# Patient Record
Sex: Female | Born: 1975
Health system: Southern US, Community
[De-identification: ages and names within clinical notes are randomized; demographics above are authoritative.]

## PROBLEM LIST (undated history)

## (undated) DIAGNOSIS — I10 Essential (primary) hypertension: Secondary | ICD-10-CM

## (undated) HISTORY — PX: EYE SURGERY: SHX253

## (undated) HISTORY — PX: HYSTERECTOMY ABDOMINAL WITH SALPINGECTOMY: SHX6725

---

## 2014-06-09 DIAGNOSIS — I10 Essential (primary) hypertension: Secondary | ICD-10-CM | POA: Diagnosis present

## 2019-08-26 ENCOUNTER — Encounter (HOSPITAL_COMMUNITY): Payer: Self-pay | Admitting: Obstetrics and Gynecology

## 2019-08-26 ENCOUNTER — Other Ambulatory Visit: Payer: Self-pay

## 2019-08-26 ENCOUNTER — Observation Stay (HOSPITAL_COMMUNITY)
Admission: EM | Admit: 2019-08-26 | Discharge: 2019-08-29 | Disposition: A | Discharge status: Still In Hospital | Payer: 59 | Attending: Family Medicine | Admitting: Family Medicine

## 2019-08-26 ENCOUNTER — Ambulatory Visit (INDEPENDENT_AMBULATORY_CARE_PROVIDER_SITE_OTHER): Payer: Self-pay | Admitting: Family Medicine

## 2019-08-26 DIAGNOSIS — Z20822 Contact with and (suspected) exposure to covid-19: Secondary | ICD-10-CM | POA: Diagnosis not present

## 2019-08-26 DIAGNOSIS — K922 Gastrointestinal hemorrhage, unspecified: Secondary | ICD-10-CM | POA: Diagnosis present

## 2019-08-26 DIAGNOSIS — I10 Essential (primary) hypertension: Secondary | ICD-10-CM | POA: Diagnosis not present

## 2019-08-26 DIAGNOSIS — K625 Hemorrhage of anus and rectum: Principal | ICD-10-CM | POA: Insufficient documentation

## 2019-08-26 HISTORY — DX: Essential (primary) hypertension: I10

## 2019-08-26 LAB — COMPREHENSIVE METABOLIC PANEL
ALT: 15 U/L (ref 0–44)
AST: 17 U/L (ref 15–41)
Albumin: 4.2 g/dL (ref 3.5–5.0)
Alkaline Phosphatase: 46 U/L (ref 38–126)
Anion gap: 8 (ref 5–15)
BUN: 14 mg/dL (ref 6–20)
CO2: 26 mmol/L (ref 22–32)
Calcium: 8.9 mg/dL (ref 8.9–10.3)
Chloride: 106 mmol/L (ref 98–111)
Creatinine, Ser: 0.97 mg/dL (ref 0.44–1.00)
GFR calc Af Amer: >60 mL/min (ref >60)
GFR calc non Af Amer: >60 mL/min (ref >60)
Glucose, Bld: 101 mg/dL — ABNORMAL HIGH (ref 70–99)
Potassium: 3.9 mmol/L (ref 3.5–5.1)
Sodium: 140 mmol/L (ref 135–145)
Total Bilirubin: 0.5 mg/dL (ref 0.3–1.2)
Total Protein: 7.1 g/dL (ref 6.5–8.1)

## 2019-08-26 LAB — CBC
HCT: 38.4 % (ref 36.0–46.0)
Hemoglobin: 12.3 g/dL (ref 12.0–15.0)
MCH: 28.1 pg (ref 26.0–34.0)
MCHC: 32 g/dL (ref 30.0–36.0)
MCV: 87.7 fL (ref 80.0–100.0)
Platelets: 281 10*3/uL (ref 150–400)
RBC: 4.38 MIL/uL (ref 3.87–5.11)
RDW: 14.4 % (ref 11.5–15.5)
WBC: 9 10*3/uL (ref 4.0–10.5)
nRBC: 0 % (ref 0.0–0.2)

## 2019-08-26 LAB — TYPE AND SCREEN
ABO/RH(D): O POS
Antibody Screen: NEGATIVE

## 2019-08-26 NOTE — ED Triage Notes (Signed)
Dr. Laurann Montana phones at this time to tell us she is sending this pt., whom she has seen in her office. Pt. C/o several episodes of hematochezia today.

## 2019-08-26 NOTE — ED Triage Notes (Signed)
Patient reports she has been having bloody/mucous like stools. Patient reports she feels lightheaded.

## 2019-08-27 ENCOUNTER — Emergency Department (HOSPITAL_COMMUNITY): Payer: 59

## 2019-08-27 ENCOUNTER — Encounter (HOSPITAL_COMMUNITY): Payer: Self-pay

## 2019-08-27 ENCOUNTER — Other Ambulatory Visit: Payer: Self-pay

## 2019-08-27 DIAGNOSIS — K922 Gastrointestinal hemorrhage, unspecified: Secondary | ICD-10-CM | POA: Diagnosis present

## 2019-08-27 DIAGNOSIS — K625 Hemorrhage of anus and rectum: Principal | ICD-10-CM

## 2019-08-27 LAB — CBC WITH DIFFERENTIAL/PLATELET
Abs Immature Granulocytes: 0.07 10*3/uL (ref 0.00–0.07)
Basophils Absolute: 0.1 10*3/uL (ref 0.0–0.1)
Basophils Relative: 1 %
Eosinophils Absolute: 0.2 10*3/uL (ref 0.0–0.5)
Eosinophils Relative: 1 %
HCT: 36.2 % (ref 36.0–46.0)
Hemoglobin: 11.7 g/dL — ABNORMAL LOW (ref 12.0–15.0)
Immature Granulocytes: 1 %
Lymphocytes Relative: 20 %
Lymphs Abs: 2.2 10*3/uL (ref 0.7–4.0)
MCH: 28.3 pg (ref 26.0–34.0)
MCHC: 32.3 g/dL (ref 30.0–36.0)
MCV: 87.4 fL (ref 80.0–100.0)
Monocytes Absolute: 0.6 10*3/uL (ref 0.1–1.0)
Monocytes Relative: 6 %
Neutro Abs: 7.8 10*3/uL — ABNORMAL HIGH (ref 1.7–7.7)
Neutrophils Relative %: 71 %
Platelets: 271 10*3/uL (ref 150–400)
RBC: 4.14 MIL/uL (ref 3.87–5.11)
RDW: 14.5 % (ref 11.5–15.5)
WBC: 10.9 10*3/uL — ABNORMAL HIGH (ref 4.0–10.5)
nRBC: 0 % (ref 0.0–0.2)

## 2019-08-27 LAB — ABO/RH: ABO/RH(D): O POS

## 2019-08-27 LAB — HEMOGLOBIN AND HEMATOCRIT, BLOOD
HCT: 32 % — ABNORMAL LOW (ref 36.0–46.0)
HCT: 30.4 % — ABNORMAL LOW (ref 36.0–46.0)
HCT: 30.3 % — ABNORMAL LOW (ref 36.0–46.0)
HCT: 29.7 % — ABNORMAL LOW (ref 36.0–46.0)
Hemoglobin: 10.1 g/dL — ABNORMAL LOW (ref 12.0–15.0)
Hemoglobin: 9.8 g/dL — ABNORMAL LOW (ref 12.0–15.0)
Hemoglobin: 9.8 g/dL — ABNORMAL LOW (ref 12.0–15.0)
Hemoglobin: 9.5 g/dL — ABNORMAL LOW (ref 12.0–15.0)

## 2019-08-27 LAB — COMPREHENSIVE METABOLIC PANEL
ALT: 16 U/L (ref 0–44)
AST: 15 U/L (ref 15–41)
Albumin: 3.7 g/dL (ref 3.5–5.0)
Alkaline Phosphatase: 36 U/L — ABNORMAL LOW (ref 38–126)
Anion gap: 7 (ref 5–15)
BUN: 13 mg/dL (ref 6–20)
CO2: 23 mmol/L (ref 22–32)
Calcium: 8.1 mg/dL — ABNORMAL LOW (ref 8.9–10.3)
Chloride: 109 mmol/L (ref 98–111)
Creatinine, Ser: 0.89 mg/dL (ref 0.44–1.00)
GFR calc Af Amer: >60 mL/min (ref >60)
GFR calc non Af Amer: >60 mL/min (ref >60)
Glucose, Bld: 120 mg/dL — ABNORMAL HIGH (ref 70–99)
Potassium: 4.1 mmol/L (ref 3.5–5.1)
Sodium: 139 mmol/L (ref 135–145)
Total Bilirubin: 0.5 mg/dL (ref 0.3–1.2)
Total Protein: 6.5 g/dL (ref 6.5–8.1)

## 2019-08-27 LAB — MAGNESIUM: Magnesium: 1.9 mg/dL (ref 1.7–2.4)

## 2019-08-27 LAB — PROTIME-INR
INR: 1 (ref 0.8–1.2)
Prothrombin Time: 12.9 seconds (ref 11.4–15.2)

## 2019-08-27 LAB — I-STAT BETA HCG BLOOD, ED (MC, WL, AP ONLY): I-stat hCG, quantitative: <5 m[IU]/mL (ref <5)

## 2019-08-27 LAB — POC OCCULT BLOOD, ED: Fecal Occult Bld: POSITIVE — AB

## 2019-08-27 LAB — HIV ANTIBODY (ROUTINE TESTING W REFLEX): HIV Screen 4th Generation wRfx: NONREACTIVE

## 2019-08-27 LAB — SARS CORONAVIRUS 2 BY RT PCR (HOSPITAL ORDER, PERFORMED IN ~~LOC~~ HOSPITAL LAB): SARS Coronavirus 2: NEGATIVE

## 2019-08-27 MED ORDER — ACETAMINOPHEN 650 MG RE SUPP: 650.0000 mg | Freq: Four times a day (QID) | RECTAL | Status: DC | PRN

## 2019-08-27 MED ORDER — IOHEXOL 300 MG/ML  SOLN
100.0000 mL | Freq: Once | INTRAMUSCULAR | Status: AC | PRN
  Administered 2019-08-27: 100 mL via INTRAVENOUS

## 2019-08-27 MED ORDER — SODIUM CHLORIDE 0.9 % IV BOLUS
1000.0000 mL | Freq: Once | INTRAVENOUS | Status: AC
  Administered 2019-08-27: 1000 mL via INTRAVENOUS

## 2019-08-27 MED ORDER — PANTOPRAZOLE SODIUM 40 MG IV SOLR
40.0000 mg | Freq: Once | INTRAVENOUS | Status: AC
  Administered 2019-08-27: 40 mg via INTRAVENOUS
  Filled 2019-08-27: qty 40

## 2019-08-27 MED ORDER — ONDANSETRON HCL 4 MG PO TABS: 4.0000 mg | ORAL_TABLET | Freq: Four times a day (QID) | ORAL | Status: DC | PRN

## 2019-08-27 MED ORDER — SODIUM CHLORIDE (PF) 0.9 % IJ SOLN
INTRAMUSCULAR | Status: AC
  Administered 2019-08-27: 3 mL
  Filled 2019-08-27: qty 50

## 2019-08-27 MED ORDER — AMLODIPINE BESYLATE 10 MG PO TABS
10.0000 mg | ORAL_TABLET | Freq: Every day | ORAL | Status: DC
  Administered 2019-08-27 – 2019-08-29 (×3): 10 mg via ORAL
  Filled 2019-08-27 (×3): qty 1

## 2019-08-27 MED ORDER — SODIUM CHLORIDE 0.9 % IV SOLN: INTRAVENOUS | Status: DC

## 2019-08-27 MED ORDER — OXYCODONE HCL 5 MG PO TABS: 5.0000 mg | ORAL_TABLET | ORAL | Status: DC | PRN

## 2019-08-27 MED ORDER — ONDANSETRON HCL 4 MG/2ML IJ SOLN
4.0000 mg | Freq: Once | INTRAMUSCULAR | Status: AC
  Administered 2019-08-27: 4 mg via INTRAVENOUS
  Filled 2019-08-27: qty 2

## 2019-08-27 MED ORDER — HYDROCHLOROTHIAZIDE 25 MG PO TABS
25.0000 mg | ORAL_TABLET | Freq: Every day | ORAL | Status: DC
  Administered 2019-08-27 – 2019-08-29 (×3): 25 mg via ORAL
  Filled 2019-08-27 (×4): qty 1

## 2019-08-27 MED ORDER — ACETAMINOPHEN 325 MG PO TABS
650.0000 mg | ORAL_TABLET | Freq: Four times a day (QID) | ORAL | Status: DC | PRN
  Administered 2019-08-27 – 2019-08-28 (×3): 650 mg via ORAL
  Filled 2019-08-27 (×3): qty 2

## 2019-08-27 MED ORDER — MORPHINE SULFATE (PF) 2 MG/ML IV SOLN: 2.0000 mg | INTRAVENOUS | Status: DC | PRN

## 2019-08-27 MED ORDER — ONDANSETRON HCL 4 MG/2ML IJ SOLN
4.0000 mg | Freq: Four times a day (QID) | INTRAMUSCULAR | Status: DC | PRN
  Administered 2019-08-28: 4 mg via INTRAVENOUS
  Filled 2019-08-27: qty 2

## 2019-08-27 MED ORDER — PANTOPRAZOLE SODIUM 40 MG IV SOLR
40.0000 mg | INTRAVENOUS | Status: DC
  Administered 2019-08-27 – 2019-08-29 (×3): 40 mg via INTRAVENOUS
  Filled 2019-08-27 (×3): qty 40

## 2019-08-27 MED ORDER — LORAZEPAM 2 MG/ML IJ SOLN
1.0000 mg | Freq: Four times a day (QID) | INTRAMUSCULAR | Status: DC | PRN
  Administered 2019-08-27: 1 mg via INTRAVENOUS
  Filled 2019-08-27: qty 1

## 2019-08-27 NOTE — Progress Notes (Signed)
Seen and examined and agree with plan of care as per my partner who admitted this patient earlier this morning 44 year old black female quite healthy DU B/HTN Admitted with rectal bleeding as per HPI She shows me pictures of her stool which looked unformed and bloody-does not take Goody's other meds or over-the-counter stuff Not a heavy drinker no smoker  On exam she is somewhat comfortable has some mild abdominal pain 3/10 seems a little bit more distended than usual per her She has not had a stool since she came in but has not eaten since coming in either  Plan we will keep her n.p.o. keep large-bore IV continue with saline at 125 an hour She will continue Protonix injection every 24 hours GI has been made aware of her being here and they may decide to scope her as she has in the past had hemorrhoidal bleeding-we will defer to them  Pleas Koch, MD [facility] 12:53 PM

## 2019-08-27 NOTE — H&P (Signed)
[facility] H&P    Patient Demographics:    Jessica Yu, is a 44 y.o. female  MRN: [id number]  DOB - 26-Apr-1975  Admit Date - 08/26/2019  Referring MD/NP/PA: Dr. Manus Gunning  Outpatient Primary MD for the patient is Adrienne Mocha, PA  Patient coming from: Practitioner's office  Chief complaint- Bright red blood per rectum   HPI:    Jessica Yu  is a 44 y.o. female, with history of hypertension, abnormal uterine bleeding status post hysterectomy, presents to the ED with a chief complaint of bright red blood per rectum. Patient reports that she was in her normal state of health up until 1:45 PM today when she started to have cramping in felt an urgent need to move her bowels. Patient reports that she thought she had a loose stool, but noticed that it was malodorous, and when she wiped it was all blood. Patient called her practitioner to set up an appointment, before she could see her practitioner she had another bloody bowel movement. She had 2 bloody bowel movements at the practitioner's office, and 6 here in the ER. In the practitioner's office rectal exam did not reveal any fissure or external cause of bleeding. ER provider reports that the rectal exam did not reveal any bleeding external hemorrhoid, but there was one small hemorrhoid observed. No internal hemorrhoids were palpated. No fissures were noted. Patient reports that her abdominal pain has been a 3-4 out of 10 and described as dull and achy. It is partially relieved with passing a bowel movement. With the last bowel movement, the pain was slightly more intense at a 5 out of 10. She has associated nausea, emesis of liquid that is nonbloody, lightheadedness, palpitations, and shortness of breath. Patient denies any chest pain. She has never had anything like this before. She is not on any blood thinners. She is not taking regular NSAIDs. Patient does report that she has had  blood transfusion in the past, this was after abnormal uterine bleeding that required a hysterectomy. Patient reports that she is willing to have blood transfusion again if needed. Lastly patient notes that her grandmother had late onset Crohn's disease.  In the ED Temperature 98.4, blood pressure 135/94, heart rate 97, respiratory rate 16, satting at 100%. CBC shows hemoglobin 11.7, white blood cell count 10.9 CHEM panel is unremarkable Patient has O+ blood FOBT was positive CT abdomen pelvis pending Covid pending Patient was kept n.p.o., given Zofran, given Protonix, 1 L of normal saline, and started on fluids 125 mL's per hour    Review of systems:    In addition to the HPI above,  No Fever-chills, No Headache, No changes with Vision or hearing, No problems swallowing food or Liquids, No Chest pain, Cough or Shortness of Breath, No Abdominal pain, No Nausea or Vomiting, bowel movements are regular, No Blood in  Urine, No dysuria, No new skin rashes or bruises, No new joints pains-aches,  No new weakness, tingling, numbness in any extremity, No recent weight gain or loss, No polyuria, polydypsia or polyphagia, No  significant Mental Stressors.  All other systems reviewed and are negative.    Past History of the following :    Past Medical History:  Diagnosis Date  . Hypertension       Past Surgical History:  Procedure Laterality Date  . EYE SURGERY    . HYSTERECTOMY ABDOMINAL WITH SALPINGECTOMY        Social History:      Social History   Tobacco Use  . Smoking status: Never Smoker  . Smokeless tobacco: Never Used  Substance Use Topics  . Alcohol use: Not Currently       Family History :    No family history on file. Grandmother had late onset Crohn's disease   Home Medications:   Prior to Admission medications   Medication Sig Start Date End Date Taking? Authorizing Provider  amLODipine (NORVASC) 10 MG tablet Take 10 mg by mouth daily. 03/30/19   Yes [provider]  hydrochlorothiazide (HYDRODIURIL) 25 MG tablet Take 25 mg by mouth daily.   Yes [provider]     Allergies:     Allergies  Allergen Reactions  . Neomycin Hives and Swelling    Other reaction(s): GI Intolerance, GI Upset (intolerance) Other reaction(s): GI Upset (intolerance) Other reaction(s): GI Upset (intolerance) Other reaction(s): GI Upset (intolerance) Other reaction(s): GI Upset (intolerance)   . Hydromorphone Nausea And Vomiting     Physical Exam:   Vitals  Blood pressure (!) 135/94, pulse 97, temperature 98.9 F (37.2 C), temperature source Oral, resp. rate 11, height 5\' 2"  (1.575 m), weight 88.5 kg, SpO2 100 %.  1.  General: Lying supine in bed in no acute distress 2. Psychiatric: Mood and behavior normal for situation, patient admits to being anxious given the circumstances  3. 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 HEENMT:  Head is atraumatic, normocephalic, pupils are reactive to light, neck is supple, trachea is midline, mucous membranes are dry  5. Respiratory : Lungs are clear to auscultation bilaterally  6. Cardiovascular : Heart rate tachycardic, rhythm is regular, no murmurs rubs or gallops  7. Gastrointestinal:  Abdomen is minimally distended, nontender to palpation, bowel sounds active, soft  8. Skin:  No acute lesions on limited skin exam  9.Musculoskeletal:  No acute deformities no peripheral edema    Data Review:    CBC Recent Labs  Lab 08/26/19 1855 08/27/19 0036  WBC 9.0 10.9*  HGB 12.3 11.7*  HCT 38.4 36.2  PLT 281 271  MCV 87.7 87.4  MCH 28.1 28.3  MCHC 32.0 32.3  RDW 14.4 14.5  LYMPHSABS  --  2.2  MONOABS  --  0.6  EOSABS  --  0.2  BASOSABS  --  0.1    ------------------------------------------------------------------------------------------------------------------  Results for orders placed or performed during the hospital encounter of 08/26/19 (from the past 48 hour(s))  Comprehensive metabolic panel     Status: Abnormal   Collection Time: 08/26/19  6:55 PM  Result Value Ref Range   Sodium 140 135 - 145 mmol/L   Potassium 3.9 3.5 - 5.1 mmol/L   Chloride 106 98 - 111 mmol/L   CO2 26 22 - 32 mmol/L   Glucose, Bld 101 (H) 70 - 99 mg/dL    Comment: Glucose reference range applies only to samples taken after fasting for at least 8 hours.   BUN 14 6 - 20  mg/dL   Creatinine, Ser 1.61 0.44 - 1.00 mg/dL   Calcium 8.9 8.9 - 09.6 mg/dL   Total Protein 7.1 6.5 - 8.1 g/dL   Albumin 4.2 3.5 - 5.0 g/dL   AST 17 15 - 41 U/L   ALT 15 0 - 44 U/L   Alkaline Phosphatase 46 38 - 126 U/L   Total Bilirubin 0.5 0.3 - 1.2 mg/dL   GFR calc non Af Amer >60 >60 mL/min   GFR calc Af Amer >60 >60 mL/min   Anion gap 8 5 - 15    Comment: Performed at [facility], [address], [place], [place] [address]  CBC     Status: None   Collection Time: 08/26/19  6:55 PM  Result Value Ref Range   WBC 9.0 4.0 - 10.5 K/uL   RBC 4.38 3.87 - 5.11 MIL/uL   Hemoglobin 12.3 12.0 - 15.0 g/dL   HCT 98.1 36 - 46 %   MCV 87.7 80.0 - 100.0 fL   MCH 28.1 26.0 - 34.0 pg   MCHC 32.0 30.0 - 36.0 g/dL   RDW 19.1 47.8 - 29.5 %   Platelets 281 150 - 400 K/uL   nRBC 0.0 0.0 - 0.2 %    Comment: Performed at [facility], [address] [place], [place] [address]  Type and screen [facility]     Status: None   Collection Time: 08/26/19  6:55 PM  Result Value Ref Range   ABO/RH(D) O POS    Antibody Screen NEG    Sample Expiration      08/29/2019,2359 Performed at [facility], [address] [place], [place] [address]   CBC WITH DIFFERENTIAL     Status: Abnormal   Collection Time: 08/27/19  12:36 AM  Result Value Ref Range   WBC 10.9 (H) 4.0 - 10.5 K/uL   RBC 4.14 3.87 - 5.11 MIL/uL   Hemoglobin 11.7 (L) 12.0 - 15.0 g/dL   HCT 46.9 36 - 46 %   MCV 87.4 80.0 - 100.0 fL   MCH 28.3 26.0 - 34.0 pg   MCHC 32.3 30.0 - 36.0 g/dL   RDW 62.9 52.8 - 41.3 %   Platelets 271 150 - 400 K/uL   nRBC 0.0 0.0 - 0.2 %   Neutrophils Relative % 71 %   Neutro Abs 7.8 (H) 1.7 - 7.7 K/uL   Lymphocytes Relative 20 %   Lymphs Abs 2.2 0.7 - 4.0 K/uL   Monocytes Relative 6 %   Monocytes Absolute 0.6 0 - 1 K/uL   Eosinophils Relative 1 %   Eosinophils Absolute 0.2 0 - 0 K/uL   Basophils Relative 1 %   Basophils Absolute 0.1 0 - 0 K/uL   Immature Granulocytes 1 %   Abs Immature Granulocytes 0.07 0.00 - 0.07 K/uL    Comment: Performed at [facility], [address], [place], [place] [address]  ABO/Rh     Status: None   Collection Time: 08/27/19 12:36 AM  Result Value Ref Range   ABO/RH(D)      O POS Performed at [facility], [address], [place], [place] [address]   POC occult blood, ED     Status: Abnormal   Collection Time: 08/27/19 12:48 AM  Result Value Ref Range   Fecal Occult Bld POSITIVE (A) NEGATIVE  I-Stat beta hCG blood, ED     Status: None   Collection Time:  08/27/19  1:35 AM  Result Value Ref Range   I-stat hCG, quantitative <5.0 <5 mIU/mL   Comment 3            Comment:   GEST. AGE      CONC.  (mIU/mL)   <=1 WEEK        5 - 50     2 WEEKS       50 - 500     3 WEEKS       100 - 10,000     4 WEEKS     1,000 - 30,000        FEMALE AND NON-PREGNANT FEMALE:     LESS THAN 5 mIU/mL     Chemistries  Recent Labs  Lab 08/26/19 1855  NA 140  K 3.9  CL 106  CO2 26  GLUCOSE 101*  BUN 14  CREATININE 0.97  CALCIUM 8.9  AST 17  ALT 15  ALKPHOS 46  BILITOT 0.5    ------------------------------------------------------------------------------------------------------------------  ------------------------------------------------------------------------------------------------------------------ GFR: Estimated Creatinine Clearance: 74.9 mL/min (by C-G formula based on SCr of 0.97 mg/dL). Liver Function Tests: Recent Labs  Lab 08/26/19 1855  AST 17  ALT 15  ALKPHOS 46  BILITOT 0.5  PROT 7.1  ALBUMIN 4.2   No results for input(s): LIPASE, AMYLASE in the last 168 hours. No results for input(s): AMMONIA in the last 168 hours. Coagulation Profile: No results for input(s): INR, PROTIME in the last 168 hours. Cardiac Enzymes: No results for input(s): CKTOTAL, CKMB, CKMBINDEX, TROPONINI in the last 168 hours. BNP (last 3 results) No results for input(s): PROBNP in the last 8760 hours. HbA1C: No results for input(s): HGBA1C in the last 72 hours. CBG: No results for input(s): GLUCAP in the last 168 hours. Lipid Profile: No results for input(s): CHOL, HDL, LDLCALC, TRIG, CHOLHDL, LDLDIRECT in the last 72 hours. Thyroid Function Tests: No results for input(s): TSH, T4TOTAL, FREET4, T3FREE, THYROIDAB in the last 72 hours. Anemia Panel: No results for input(s): VITAMINB12, FOLATE, FERRITIN, TIBC, IRON, RETICCTPCT in the last 72 hours.  --------------------------------------------------------------------------------------------------------------- Urine analysis: No results found for: COLORURINE, APPEARANCEUR, LABSPEC, PHURINE, GLUCOSEU, HGBUR, BILIRUBINUR, KETONESUR, PROTEINUR, UROBILINOGEN, NITRITE, LEUKOCYTESUR    Imaging Results:    CT ABDOMEN PELVIS W CONTRAST  Result Date: 08/27/2019 CLINICAL DATA:  44 year old female with episodes of hematochezia. EXAM: CT ABDOMEN AND PELVIS WITH CONTRAST TECHNIQUE: Multidetector CT imaging of the abdomen and pelvis was performed using the standard protocol following bolus administration of intravenous  contrast. CONTRAST:  OMNIPAQUE IOHEXOL 300 MG/ML  SOLN COMPARISON:  [facility] Noncontrast CT Abdomen and Pelvis 11/02/2013 FINDINGS: Lower chest: Cardiac size is at the upper limits of normal. Stable mild elevation of the right hemidiaphragm. Negative lung bases. Hepatobiliary: Negative liver and gallbladder. Pancreas: Negative. Spleen: Negative. Adrenals/Urinary Tract: Normal adrenal glands. Symmetric renal enhancement and contrast excretion. Normal proximal ureters. No nephrolithiasis. Tiny benign left renal lower pole cysts suspected. Diminutive and unremarkable urinary bladder. Chronic pelvic phleboliths. Stomach/Bowel: Negative rectum and distal sigmoid. The mid and proximal sigmoid is mildly redundant with mild to moderate diverticulosis, but no active inflammation. There is diverticulosis throughout the descending colon, but no active inflammation. Diverticulosis throughout the transverse colon and ascending colon, with no areas of active inflammation identified. The cecum is on a lax mesentery located in the right upper quadrant. Normal appendix on coronal image 71. Negative terminal ileum. No dilated small bowel. Decompressed stomach and duodenum. No free air, free fluid. Vascular/Lymphatic: Major  arterial structures are patent. Minimal iliac atherosclerosis. Portal venous system appears patent. No lymphadenopathy. Reproductive: Negative, small physiologic left ovarian cysts suspected on series 2, image 63. Other: No pelvic free fluid. Musculoskeletal: Mild grade 1 anterolisthesis has developed at L4-L5 since 2015, with associated facet arthropathy. No pars fracture. No acute osseous abnormality identified. IMPRESSION: 1. Severe large bowel diverticulosis. No areas of active inflammation identified, but favor diverticular disease related lower GI bleeding in this setting. Nuclear Medicine Tagged RBC scan has the highest sensitivity for active GI bleeding. 2. Otherwise no acute or  inflammatory process identified in the abdomen or pelvis. 3. Mild grade 1 anterolisthesis at L4-L5 has developed since 2015 with associated facet arthropathy. Electronically Signed   By: Odessa FlemingH  Hall M.D.   On: 08/27/2019 02:08     Assessment & Plan:    Active Problems:   GI bleed   1. GI bleed 1. Total 9 bloody DMs 2. Hgb from 12.3 at outpatient office today to 11.7 over 6 hours 3. Type and screen 4. Monitor HH q6 hours 5. Protonix 6. GI consult 7. NPO 8. IV fluids 14925ml/hour 9. Transfuse if hgb is trending below 7 2. HTN 1. Continue home medications 2. BP 135/94 in ER 3. Tachycardia 1. Likely 2/2 to volume depletion and anxiety 2. 1 L in ED 3. Continue IV fluids 4. Anxiety 1. PRN ativan    DVT Prophylaxis-    SCDs   AM Labs Ordered, also please review Full Orders  Family Communication: No family at bedside Code Status:  Full  Admission status:Inpatient :The appropriate admission status for this patient is INPATIENT. Inpatient status is judged to be reasonable and necessary in order to provide the required intensity of service to ensure the patient's safety. The patient's presenting symptoms, physical exam findings, and initial radiographic and laboratory data in the context of their chronic comorbidities is felt to place them at high risk for further clinical deterioration. Furthermore, it is not anticipated that the patient will be medically stable for discharge from the hospital within 2 midnights of admission. The following factors support the admission status of inpatient.     The patient's presenting symptoms include Bright red blood per rectum The worrisome physical exam findings include + FOBT The initial radiographic and laboratory data are worrisome because of - CT pending The chronic co-morbidities include HTN       * I certify that at the point of admission it is my clinical judgment that the patient will require inpatient hospital care spanning beyond 2  midnights from the point of admission due to high intensity of service, high risk for further deterioration and high frequency of surveillance required.*  Time spent in minutes : 67   Emidio Warrell B Zierle-Ghosh DO

## 2019-08-27 NOTE — ED Provider Notes (Signed)
[facility]-EMERGENCY DEPT Provider Note   CSN: [id number] Arrival date & time: 08/26/19  1727     History Chief Complaint  Patient presents with  . GI Bleeding    Jessica Yu is a 44 y.o. female.  Patient with history of hypertension sent from PCPs office with rectal bleeding.  States she has had rectal bleeding all day.  Had a cramping sensation her abdomen at work earlier today and felt like she needed to have a bowel movement.  She passed large volume of bright red blood with clots.  Does not think there is any stool in it.  Has crampy diffuse abdominal pain.  She continued to have multiple episodes of rectal bleeding at work as well as at her PCPs office.  Since she has been waiting, she has had approximately 6 episodes of bright red blood per rectum with clots.  The toilet bowl did turn red and there was wiped with wiping.  Denies seeing any stool.  No recent black or bloody stools that she knows of.  No history of hemorrhoids.  Does have rectal pain now at this time.  Nausea but no vomiting.  No fever.  Does feel dizzy and lightheaded.  No chest pain or shortness of breath.  No blood thinner use.  She has never had a colonoscopy.  No history of previous rectal bleeding or abdominal issues.  The history is provided by the patient.       Past Medical History:  Diagnosis Date  . Hypertension     There are no problems to display for this patient.   Past Surgical History:  Procedure Laterality Date  . EYE SURGERY    . HYSTERECTOMY ABDOMINAL WITH SALPINGECTOMY       OB History   No obstetric history on file.     No family history on file.  Social History   Tobacco Use  . Smoking status: Never Smoker  . Smokeless tobacco: Never Used  Vaping Use  . Vaping Use: Never used  Substance Use Topics  . Alcohol use: Not Currently  . Drug use: Not Currently    Home Medications Prior to Admission medications   Not on File    Allergies      Patient has no allergy information on record.  Review of Systems   Review of Systems  Constitutional: Positive for fatigue. Negative for activity change, appetite change and fever.  HENT: Negative for congestion and rhinorrhea.   Eyes: Negative for visual disturbance.  Respiratory: Negative for cough, chest tightness and shortness of breath.   Gastrointestinal: Positive for abdominal pain, blood in stool, nausea and rectal pain. Negative for vomiting.  Genitourinary: Negative for dysuria.  Musculoskeletal: Negative for arthralgias and myalgias.  Skin: Negative for rash.  Neurological: Positive for weakness. Negative for dizziness and headaches.   all other systems are negative except as noted in the HPI and PMH.    Physical Exam Updated Vital Signs BP (!) 179/137 (BP Location: Left Arm)   Pulse 98   Temp 98.9 F (37.2 C) (Oral)   Resp 16   Ht 5\' 2"  (1.575 m)   Wt 88.5 kg   SpO2 98%   BMI 35.67 kg/m   Physical Exam Vitals and nursing note reviewed.  Constitutional:      General: She is not in acute distress.    Appearance: She is well-developed.     Comments: Pale appearing  HENT:     Head: Normocephalic and atraumatic.  Mouth/Throat:     Pharynx: No oropharyngeal exudate.  Eyes:     Conjunctiva/sclera: Conjunctivae normal.     Pupils: Pupils are equal, round, and reactive to light.     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 Cardiovascular:     Rate and Rhythm: Normal rate and regular rhythm.     Heart sounds: Normal heart sounds. No murmur heard.   Pulmonary:     Effort: Pulmonary effort is normal. No respiratory distress.     Breath sounds: Normal breath sounds.  Abdominal:     Palpations: Abdomen is soft.     Tenderness: There is abdominal tenderness. There is no guarding or rebound.     Comments: Soft abdomen, diffusely tender. No guarding or rebound  Genitourinary:    Comments: Chaperone present.   No obvious external hemorrhoids.  Bright red blood on digital exam Musculoskeletal:        General: No tenderness. Normal range of motion.     Cervical back: Normal range of motion and neck supple.  Skin:    General: Skin is warm.  Neurological:     Mental Status: She is alert and oriented to person, place, and time.     Cranial Nerves: No cranial nerve deficit.     Motor: No abnormal muscle tone.     Coordination: Coordination normal.     Comments:  5/5 strength throughout. CN 2-12 intact.Equal grip strength.   Psychiatric:        Behavior: Behavior normal.     ED Results / Procedures / Treatments   Labs (all labs ordered are listed, but only abnormal results are displayed)   EKG None  Radiology CT ABDOMEN PELVIS W CONTRAST  Result Date: 08/27/2019 CLINICAL DATA:  44 year old female with episodes of hematochezia. EXAM: CT ABDOMEN AND PELVIS WITH CONTRAST TECHNIQUE: Multidetector CT imaging of the abdomen and pelvis was performed using the standard protocol following bolus administration of intravenous contrast. CONTRAST:  OMNIPAQUE IOHEXOL 300 MG/ML  SOLN COMPARISON:  [facility] Noncontrast CT Abdomen and Pelvis 11/02/2013 FINDINGS: Lower chest: Cardiac size is at the upper limits of normal. Stable mild elevation of the right hemidiaphragm. Negative lung bases. Hepatobiliary: Negative liver and gallbladder. Pancreas: Negative. Spleen: Negative. Adrenals/Urinary Tract: Normal adrenal glands. Symmetric renal enhancement and contrast excretion. Normal proximal ureters. No nephrolithiasis. Tiny benign left renal lower pole cysts suspected. Diminutive and unremarkable urinary bladder. Chronic pelvic phleboliths. Stomach/Bowel: Negative rectum and distal sigmoid. The mid and proximal sigmoid is mildly redundant with mild to moderate diverticulosis, but no active inflammation. There is diverticulosis throughout the descending colon, but no active inflammation.  Diverticulosis throughout the transverse colon and ascending colon, with no areas of active inflammation identified. The cecum is on a lax mesentery located in the right upper quadrant. Normal appendix on coronal image 71. Negative terminal ileum. No dilated small bowel. Decompressed stomach and duodenum. No free air, free fluid. Vascular/Lymphatic: Major arterial structures are patent. Minimal iliac atherosclerosis. Portal venous system appears patent. No lymphadenopathy. Reproductive: Negative, small physiologic left ovarian cysts suspected on series 2, image 63. Other: No pelvic free fluid. Musculoskeletal: Mild grade 1 anterolisthesis has developed at L4-L5 since 2015, with associated facet arthropathy. No pars fracture. No acute osseous abnormality identified. IMPRESSION: 1. Severe large bowel diverticulosis. No areas of active inflammation identified, but favor diverticular disease related lower GI bleeding in this setting. Nuclear Medicine Tagged RBC scan has  the highest sensitivity for active GI bleeding. 2. Otherwise no acute or inflammatory process identified in the abdomen or pelvis. 3. Mild grade 1 anterolisthesis at L4-L5 has developed since 2015 with associated facet arthropathy. Electronically Signed   By: Odessa Fleming M.D.   On: 08/27/2019 02:08    Procedures Procedures (including critical care time)  Medications Ordered in ED Medications  sodium chloride 0.9 % bolus 1,000 mL (has no administration in time range)    And  0.9 %  sodium chloride infusion (has no administration in time range)  pantoprazole (PROTONIX) injection 40 mg (has no administration in time range)  ondansetron (ZOFRAN) injection 4 mg (has no administration in time range)    ED Course  I have reviewed the triage vital signs and the nursing notes.  Pertinent labs & imaging results that were available during my care of the patient were reviewed by me and considered in my medical decision making (see chart for  details).    MDM Rules/Calculators/A&P                         Patient with ongoing rectal bleeding with clots.  Vitals remained stable.  She reports several episodes since she has been waiting.  Initial hemoglobin was 12 without comparison.  Abdomen is soft without peritoneal signs.  Patient will be given IV fluids, type and screen, CT abdomen.  Suspect diverticular bleed.  She does not take any blood thinners and has never had a colonoscopy.  Repeat blood pressure 135/94.  Repeat hemoglobin 11.7 from 12.  Hemoccult is grossly positive  Vitals remained stable.  Bright red blood per rectum without obvious external source.  CT is pending to evaluate for diverticula. Abdomen is soft without peritoneal signs.  Message sent to gastroenterology for evaluation in the morning. CT shows severe large bowel diverticulosis without areas of active inflammation. Admission discussed with Dr. Carren Rang. Final Clinical Impression(s) / ED Diagnoses Final diagnoses:  Rectal bleeding    Rx / DC Orders ED Discharge Orders    None       Benford Asch, Jeannett Senior, MD 08/27/19 0221

## 2019-08-27 NOTE — Consult Note (Signed)
Referring Provider:  [facility] Primary Care Physician:  Adrienne Mocha, PA Primary Gastroenterologist: Deboraha Sprang primary   Reason for Consultation: Rectal bleeding  HPI: Jessica Yu is a 44 y.o. female was sent to emergency room from primary care physician's office for further evaluation of rectal bleeding.  Rectal exam done in the emergency room showed no obvious external hemorrhoids and bright blood on the finger.  Her hemoglobin was normal yesterday at 12.3.  Hemoglobin has dropped to 9.8.  Normal CMP.  Normal INR.  CT abdomen pelvis with contrast showed severe large bowel diverticulosis without any evidence of active bleeding or inflammation.  GI is consulted for further evaluation.  Patient seen and examined at bedside.  She was asymptomatic until yesterday afternoon when she started having rectal bleeding.  Multiple episodes since then.  Was seen by PCP and was advised to come to the hospital for further evaluation.  No bowel movement this afternoon.  She is complaining of some nausea but denies any vomiting.  Abdominal discomfort prior to having bowel movements.  No previous episodes of GI bleed.  No previous EGD or colonoscopy.  Mother was diagnosed with Crohn's disease.  No family history of colon cancer  Past Medical History:  Diagnosis Date  . Hypertension     Past Surgical History:  Procedure Laterality Date  . EYE SURGERY    . HYSTERECTOMY ABDOMINAL WITH SALPINGECTOMY      Prior to Admission medications   Medication Sig Start Date End Date Taking? Authorizing Provider  amLODipine (NORVASC) 10 MG tablet Take 10 mg by mouth daily. 03/30/19  Yes [provider]  hydrochlorothiazide (HYDRODIURIL) 25 MG tablet Take 25 mg by mouth daily.   Yes [provider]    Scheduled Meds: . amLODipine  10 mg Oral Daily  . hydrochlorothiazide  25 mg Oral Daily  . pantoprazole (PROTONIX) IV  40 mg Intravenous Q24H   Continuous Infusions: . sodium chloride 125 mL/hr at  08/27/19 0411   PRN Meds:.acetaminophen **OR** acetaminophen, LORazepam, ondansetron **OR** ondansetron (ZOFRAN) IV  Allergies as of 08/26/2019  . (Not on File)    History reviewed. No pertinent family history.  Social History   Socioeconomic History  . Marital status: Single    Spouse name: Not on file  . Number of children: Not on file  . Years of education: Not on file  . Highest education level: Not on file  Occupational History  . Not on file  Tobacco Use  . Smoking status: Never Smoker  . Smokeless tobacco: Never Used  Vaping Use  . Vaping Use: Never used  Substance and Sexual Activity  . Alcohol use: Not Currently  . Drug use: Not Currently  . Sexual activity: Yes  Other Topics Concern  . Not on file  Social History Narrative  . Not on file   Social Determinants of Health   Financial Resource Strain:   . Difficulty of Paying Living Expenses:   Food Insecurity:   . Worried About Programme researcher, broadcasting/film/video in the Last Year:   . Barista in the Last Year:   Transportation Needs:   . Freight forwarder (Medical):   Marland Kitchen Lack of Transportation (Non-Medical):   Physical Activity:   . Days of Exercise per Week:   . Minutes of Exercise per Session:   Stress:   . Feeling of Stress :   Social Connections:   . Frequency of Communication with Friends and Family:   . Frequency of  Social Gatherings with Friends and Family:   . Attends Religious Services:   . Active Member of Clubs or Organizations:   . Attends Banker Meetings:   Marland Kitchen Marital Status:   Intimate Partner Violence:   . Fear of Current or Ex-Partner:   . Emotionally Abused:   Marland Kitchen Physically Abused:   . Sexually Abused:     Review of Systems: Review of Systems  Constitutional: Negative for chills and fever.  HENT: Negative for hearing loss and tinnitus.   Eyes: Negative for blurred vision and double vision.  Respiratory: Negative for cough and hemoptysis.   Cardiovascular: Negative for  chest pain and palpitations.  Gastrointestinal: Positive for blood in stool and nausea. Negative for abdominal pain, constipation, diarrhea, heartburn and vomiting.  Genitourinary: Negative for dysuria and urgency.  Musculoskeletal: Negative for myalgias and neck pain.  Skin: Negative for itching and rash.  Neurological: Negative for seizures and loss of consciousness.  Psychiatric/Behavioral: Negative for hallucinations and substance abuse.    Physical Exam: Vital signs: Vitals:   08/27/19 0944 08/27/19 1412  BP: (!) 161/98 (!) 155/95  Pulse: 82 86  Resp: 17 17  Temp: 98.7 F (37.1 C) 97.7 F (36.5 C)  SpO2: 100% 100%   Last BM Date: 08/27/19 Physical Exam Vitals reviewed.  Constitutional:      General: She is not in acute distress.    Appearance: Normal appearance.  HENT:     Head: Normocephalic and atraumatic.     Nose: Nose normal.     Mouth/Throat:     Mouth: Mucous membranes are moist.  Eyes:     General: No scleral icterus.    Extraocular Movements: Extraocular movements intact.  Cardiovascular:     Rate and Rhythm: Normal rate and regular rhythm.     Heart sounds: No murmur heard.   Pulmonary:     Effort: Pulmonary effort is normal. No respiratory distress.  Abdominal:     General: Bowel sounds are normal. There is no distension.     Palpations: Abdomen is soft.     Tenderness: There is no abdominal tenderness. There is no guarding.  Musculoskeletal:        General: No swelling. Normal range of motion.     Cervical back: Normal range of motion and neck supple.  Skin:    General: Skin is warm.     Coloration: Skin is not jaundiced.  Neurological:     Mental Status: She is alert and oriented to person, place, and time.  Psychiatric:        Mood and Affect: Mood normal.        Thought Content: Thought content normal.     GI:  Lab Results: Recent Labs    08/26/19 1855 08/26/19 1855 08/27/19 0036 08/27/19 0431 08/27/19 1125  WBC 9.0  --  10.9*   --   --   HGB 12.3   < > 11.7* 10.1* 9.8*  HCT 38.4   < > 36.2 32.0* 30.4*  PLT 281  --  271  --   --    < > = values in this interval not displayed.   BMET Recent Labs    08/26/19 1855 08/27/19 0431  NA 140 139  K 3.9 4.1  CL 106 109  CO2 26 23  GLUCOSE 101* 120*  BUN 14 13  CREATININE 0.97 0.89  CALCIUM 8.9 8.1*   LFT Recent Labs    08/27/19 0431  PROT 6.5  ALBUMIN 3.7  AST 15  ALT 16  ALKPHOS 36*  BILITOT 0.5   PT/INR Recent Labs    08/27/19 0431  LABPROT 12.9  INR 1.0     Studies/Results: CT ABDOMEN PELVIS W CONTRAST  Result Date: 08/27/2019 CLINICAL DATA:  44 year old female with episodes of hematochezia. EXAM: CT ABDOMEN AND PELVIS WITH CONTRAST TECHNIQUE: Multidetector CT imaging of the abdomen and pelvis was performed using the standard protocol following bolus administration of intravenous contrast. CONTRAST:  OMNIPAQUE IOHEXOL 300 MG/ML  SOLN COMPARISON:  [facility] Noncontrast CT Abdomen and Pelvis 11/02/2013 FINDINGS: Lower chest: Cardiac size is at the upper limits of normal. Stable mild elevation of the right hemidiaphragm. Negative lung bases. Hepatobiliary: Negative liver and gallbladder. Pancreas: Negative. Spleen: Negative. Adrenals/Urinary Tract: Normal adrenal glands. Symmetric renal enhancement and contrast excretion. Normal proximal ureters. No nephrolithiasis. Tiny benign left renal lower pole cysts suspected. Diminutive and unremarkable urinary bladder. Chronic pelvic phleboliths. Stomach/Bowel: Negative rectum and distal sigmoid. The mid and proximal sigmoid is mildly redundant with mild to moderate diverticulosis, but no active inflammation. There is diverticulosis throughout the descending colon, but no active inflammation. Diverticulosis throughout the transverse colon and ascending colon, with no areas of active inflammation identified. The cecum is on a lax mesentery located in the right upper quadrant. Normal appendix on  coronal image 71. Negative terminal ileum. No dilated small bowel. Decompressed stomach and duodenum. No free air, free fluid. Vascular/Lymphatic: Major arterial structures are patent. Minimal iliac atherosclerosis. Portal venous system appears patent. No lymphadenopathy. Reproductive: Negative, small physiologic left ovarian cysts suspected on series 2, image 63. Other: No pelvic free fluid. Musculoskeletal: Mild grade 1 anterolisthesis has developed at L4-L5 since 2015, with associated facet arthropathy. No pars fracture. No acute osseous abnormality identified. IMPRESSION: 1. Severe large bowel diverticulosis. No areas of active inflammation identified, but favor diverticular disease related lower GI bleeding in this setting. Nuclear Medicine Tagged RBC scan has the highest sensitivity for active GI bleeding. 2. Otherwise no acute or inflammatory process identified in the abdomen or pelvis. 3. Mild grade 1 anterolisthesis at L4-L5 has developed since 2015 with associated facet arthropathy. Electronically Signed   By: Odessa Fleming M.D.   On: 08/27/2019 02:08    Impression/Plan: -Acute onset of hematochezia.  CT scan showing severe diverticulosis.  Most likely diverticular bleed.  Improving.  No bowel movement this afternoon. -Acute blood loss anemia  Recommendations -------------------------- -GI bleed scan to rule out active bleeding.  If positive, recommend IR consult for embolization. -If GI bleeding scan negative and if she has ongoing bleeding, she may need inpatient colonoscopy. -If GI bleed scan negative and no further bleeding episodes, recommend outpatient colonoscopy. -Okay to have clear liquid diet -GI will follow    LOS: 0 days   Kathi Der  MD, FACP 08/27/2019, 2:40 PM  Contact #  [phone]

## 2019-08-28 ENCOUNTER — Inpatient Hospital Stay (HOSPITAL_COMMUNITY): Payer: 59

## 2019-08-28 DIAGNOSIS — K625 Hemorrhage of anus and rectum: Secondary | ICD-10-CM | POA: Diagnosis not present

## 2019-08-28 LAB — CBC WITH DIFFERENTIAL/PLATELET
Abs Immature Granulocytes: 0.02 10*3/uL (ref 0.00–0.07)
Basophils Absolute: 0 10*3/uL (ref 0.0–0.1)
Basophils Relative: 1 %
Eosinophils Absolute: 0.2 10*3/uL (ref 0.0–0.5)
Eosinophils Relative: 3 %
HCT: 30.6 % — ABNORMAL LOW (ref 36.0–46.0)
Hemoglobin: 9.7 g/dL — ABNORMAL LOW (ref 12.0–15.0)
Immature Granulocytes: 0 %
Lymphocytes Relative: 42 %
Lymphs Abs: 2.7 10*3/uL (ref 0.7–4.0)
MCH: 28 pg (ref 26.0–34.0)
MCHC: 31.7 g/dL (ref 30.0–36.0)
MCV: 88.4 fL (ref 80.0–100.0)
Monocytes Absolute: 0.4 10*3/uL (ref 0.1–1.0)
Monocytes Relative: 6 %
Neutro Abs: 3 10*3/uL (ref 1.7–7.7)
Neutrophils Relative %: 48 %
Platelets: 235 10*3/uL (ref 150–400)
RBC: 3.46 MIL/uL — ABNORMAL LOW (ref 3.87–5.11)
RDW: 14.5 % (ref 11.5–15.5)
WBC: 6.4 10*3/uL (ref 4.0–10.5)
nRBC: 0 % (ref 0.0–0.2)

## 2019-08-28 LAB — COMPREHENSIVE METABOLIC PANEL
ALT: 16 U/L (ref 0–44)
AST: 16 U/L (ref 15–41)
Albumin: 3.7 g/dL (ref 3.5–5.0)
Alkaline Phosphatase: 38 U/L (ref 38–126)
Anion gap: 8 (ref 5–15)
BUN: 9 mg/dL (ref 6–20)
CO2: 25 mmol/L (ref 22–32)
Calcium: 8.2 mg/dL — ABNORMAL LOW (ref 8.9–10.3)
Chloride: 104 mmol/L (ref 98–111)
Creatinine, Ser: 0.8 mg/dL (ref 0.44–1.00)
GFR calc Af Amer: >60 mL/min (ref >60)
GFR calc non Af Amer: >60 mL/min (ref >60)
Glucose, Bld: 101 mg/dL — ABNORMAL HIGH (ref 70–99)
Potassium: 3.4 mmol/L — ABNORMAL LOW (ref 3.5–5.1)
Sodium: 137 mmol/L (ref 135–145)
Total Bilirubin: 0.6 mg/dL (ref 0.3–1.2)
Total Protein: 6.4 g/dL — ABNORMAL LOW (ref 6.5–8.1)

## 2019-08-28 MED ORDER — MENTHOL 3 MG MT LOZG: 1.0000 | LOZENGE | OROMUCOSAL | Status: DC | PRN

## 2019-08-28 MED ORDER — TECHNETIUM TC 99M-LABELED RED BLOOD CELLS IV KIT
22.8000 | PACK | Freq: Once | INTRAVENOUS | Status: AC | PRN
  Administered 2019-08-28: 22.8 via INTRAVENOUS

## 2019-08-28 NOTE — Progress Notes (Signed)
Patient had urge to have bowel movement.  Patient just finished eating soft diet for dinner about 30 minutes prior.  Four dark, nickel size blobs with smooth edges seen. No fecal matter seen.  Patient denies any SOB, lightheadedness, or dizziness.  Will continue to monitor patient.

## 2019-08-28 NOTE — Progress Notes (Signed)
PROGRESS NOTE    Jessica Yu  ZJI:[id number] DOB: 08-Nov-1975 DOA: 08/26/2019 PCP: Adrienne Mocha, PA  Brief Narrative:  44 year old black female known DU B HTN Admitted 8/13 with dark stools unformed-does not take chronic OTC NSAIDs drink or smoke  GI consulted recommended nuclear medicine scan possible lower GI bleed   Assessment & Plan:   Active Problems:   GI bleed   1. ?  Hemorrhoidal bleed versus lower diverticular bleed a. Tells me he has family history of Crohn's in sister and mother b. We will observe over the course of today on a soft diet and if she does not have large amounts of bleeding I think she can safely be discharged home a.m. 8/15 for outpatient colonoscopy c. I will let Dr. Georgiann Cocker know of her having blood with wiping after passing stool however this sounds more hemorrhoidal than it sounds diverticular  ?  Allergic reaction  Was given Ativan overnight on 8/13 and developed a little bit of discomfort in her throat I am giving herself because she has had absolutely no hives no rash or wheezing and I have instructed nursing not to redose any further as needed meds of this nature HTN is quite stable on her usual home regimen   DVT prophylaxis: SCD Code Status: Full Family Communication: None Disposition:   Status is: Inpatient  Remains inpatient appropriate because:Hemodynamically unstable, Ongoing active pain requiring inpatient pain management and Altered mental status   Dispo: The patient is from: Home              Anticipated d/c is to: Home              Anticipated d/c date is: 1 day              Patient currently is not medically stable to d/c.       Consultants:   GI  Procedures: Nuclear medicine scan performed on 8/14 which is negative  Antimicrobials: None   Subjective: Awake alert anxious Feels like her uvula is swollen and I examined it No chest pain no fever some blood with wiping which was bright red however no melanotic  features  Objective: Vitals:   08/27/19 1412 08/27/19 1748 08/27/19 2056 08/28/19 0601  BP: (!) 155/95 (!) 135/97 (!) 147/91 (!) 133/97  Pulse: 86 84 84 84  Resp: 17 17 17 17   Temp: 97.7 F (36.5 C) 98.5 F (36.9 C) 98.2 F (36.8 C) 98 F (36.7 C)  TempSrc: Oral Oral Oral Oral  SpO2: 100% 100% 100% 100%  Weight:    87.1 kg  Height:        Intake/Output Summary (Last 24 hours) at 08/28/2019 1502 Last data filed at 08/28/2019 0601 Gross per 24 hour  Intake 500 ml  Output 601 ml  Net -101 ml   Filed Weights   08/26/19 1817 08/28/19 0601  Weight: 88.5 kg 87.1 kg    Examination:  General exam: EOMI NCAT congenital?  Deficit to the right eye no icterus no pallor Respiratory system: Clear no added sound no rales no rhonchi Cardiovascular system: S1-S2 no murmur Gastrointestinal system: Soft no rebound no guarding deferred rectal exam. Central nervous system: Intact moving all 4 limbs equally no focal deficit Extremities: ROM intact Skin: No lower extremity edema no rash no hives Psychiatry: Slight anxiety 25  Data Reviewed: I have personally reviewed following labs and imaging studies   Radiology Studies: NM GI Blood Loss  Result Date: 08/28/2019 CLINICAL DATA:  44 year old female GI bleeding EXAM: NUCLEAR MEDICINE GASTROINTESTINAL BLEEDING SCAN TECHNIQUE: Sequential abdominal images were obtained following intravenous administration of Tc-47m labeled red blood cells. RADIOPHARMACEUTICALS:  22.8 mCi Tc-27m pertechnetate in-vitro labeled red cells. COMPARISON:  None. FINDINGS: Planar images of the abdomen/pelvis were performed after administration of the radiotracer. Adequate blood pool images, with accumulation of excreted radiotracer within the urinary bladder. After 2 hours of planar imaging, there is no evidence of accumulation in the GI system. IMPRESSION: Negative GI bleeding study. Electronically Signed   By: Gilmer Mor D.O.   On: 08/28/2019 14:51   CT ABDOMEN PELVIS  W CONTRAST  Result Date: 08/27/2019 CLINICAL DATA:  44 year old female with episodes of hematochezia. EXAM: CT ABDOMEN AND PELVIS WITH CONTRAST TECHNIQUE: Multidetector CT imaging of the abdomen and pelvis was performed using the standard protocol following bolus administration of intravenous contrast. CONTRAST:  OMNIPAQUE IOHEXOL 300 MG/ML  SOLN COMPARISON:  [facility] Noncontrast CT Abdomen and Pelvis 11/02/2013 FINDINGS: Lower chest: Cardiac size is at the upper limits of normal. Stable mild elevation of the right hemidiaphragm. Negative lung bases. Hepatobiliary: Negative liver and gallbladder. Pancreas: Negative. Spleen: Negative. Adrenals/Urinary Tract: Normal adrenal glands. Symmetric renal enhancement and contrast excretion. Normal proximal ureters. No nephrolithiasis. Tiny benign left renal lower pole cysts suspected. Diminutive and unremarkable urinary bladder. Chronic pelvic phleboliths. Stomach/Bowel: Negative rectum and distal sigmoid. The mid and proximal sigmoid is mildly redundant with mild to moderate diverticulosis, but no active inflammation. There is diverticulosis throughout the descending colon, but no active inflammation. Diverticulosis throughout the transverse colon and ascending colon, with no areas of active inflammation identified. The cecum is on a lax mesentery located in the right upper quadrant. Normal appendix on coronal image 71. Negative terminal ileum. No dilated small bowel. Decompressed stomach and duodenum. No free air, free fluid. Vascular/Lymphatic: Major arterial structures are patent. Minimal iliac atherosclerosis. Portal venous system appears patent. No lymphadenopathy. Reproductive: Negative, small physiologic left ovarian cysts suspected on series 2, image 63. Other: No pelvic free fluid. Musculoskeletal: Mild grade 1 anterolisthesis has developed at L4-L5 since 2015, with associated facet arthropathy. No pars fracture. No acute osseous  abnormality identified. IMPRESSION: 1. Severe large bowel diverticulosis. No areas of active inflammation identified, but favor diverticular disease related lower GI bleeding in this setting. Nuclear Medicine Tagged RBC scan has the highest sensitivity for active GI bleeding. 2. Otherwise no acute or inflammatory process identified in the abdomen or pelvis. 3. Mild grade 1 anterolisthesis at L4-L5 has developed since 2015 with associated facet arthropathy. Electronically Signed   By: Odessa Fleming M.D.   On: 08/27/2019 02:08     Scheduled Meds: . amLODipine  10 mg Oral Daily  . hydrochlorothiazide  25 mg Oral Daily  . pantoprazole (PROTONIX) IV  40 mg Intravenous Q24H   Continuous Infusions:   LOS: 1 day    Time spent: 25  Rhetta Mura, MD [facility] To contact the attending provider between 7A-7P or the covering provider during after hours 7P-7A, please log into the web site [email or web address] and access using universal [place] password for that web site. If you do not have the password, please call the hospital operator.  08/28/2019, 3:02 PM

## 2019-08-29 ENCOUNTER — Encounter: Payer: Self-pay | Admitting: Family Medicine

## 2019-08-29 DIAGNOSIS — K625 Hemorrhage of anus and rectum: Secondary | ICD-10-CM | POA: Diagnosis not present

## 2019-08-29 LAB — COMPREHENSIVE METABOLIC PANEL
ALT: 15 U/L (ref 0–44)
AST: 15 U/L (ref 15–41)
Albumin: 3.7 g/dL (ref 3.5–5.0)
Alkaline Phosphatase: 39 U/L (ref 38–126)
Anion gap: 7 (ref 5–15)
BUN: 12 mg/dL (ref 6–20)
CO2: 27 mmol/L (ref 22–32)
Calcium: 8.7 mg/dL — ABNORMAL LOW (ref 8.9–10.3)
Chloride: 104 mmol/L (ref 98–111)
Creatinine, Ser: 1.01 mg/dL — ABNORMAL HIGH (ref 0.44–1.00)
GFR calc Af Amer: >60 mL/min (ref >60)
GFR calc non Af Amer: >60 mL/min (ref >60)
Glucose, Bld: 97 mg/dL (ref 70–99)
Potassium: 3.6 mmol/L (ref 3.5–5.1)
Sodium: 138 mmol/L (ref 135–145)
Total Bilirubin: 0.3 mg/dL (ref 0.3–1.2)
Total Protein: 6.7 g/dL (ref 6.5–8.1)

## 2019-08-29 LAB — CBC WITH DIFFERENTIAL/PLATELET
Abs Immature Granulocytes: 0.02 10*3/uL (ref 0.00–0.07)
Basophils Absolute: 0 10*3/uL (ref 0.0–0.1)
Basophils Relative: 1 %
Eosinophils Absolute: 0.3 10*3/uL (ref 0.0–0.5)
Eosinophils Relative: 4 %
HCT: 31.7 % — ABNORMAL LOW (ref 36.0–46.0)
Hemoglobin: 10.1 g/dL — ABNORMAL LOW (ref 12.0–15.0)
Immature Granulocytes: 0 %
Lymphocytes Relative: 42 %
Lymphs Abs: 2.8 10*3/uL (ref 0.7–4.0)
MCH: 27.8 pg (ref 26.0–34.0)
MCHC: 31.9 g/dL (ref 30.0–36.0)
MCV: 87.3 fL (ref 80.0–100.0)
Monocytes Absolute: 0.6 10*3/uL (ref 0.1–1.0)
Monocytes Relative: 9 %
Neutro Abs: 3 10*3/uL (ref 1.7–7.7)
Neutrophils Relative %: 44 %
Platelets: 269 10*3/uL (ref 150–400)
RBC: 3.63 MIL/uL — ABNORMAL LOW (ref 3.87–5.11)
RDW: 14.4 % (ref 11.5–15.5)
WBC: 6.6 10*3/uL (ref 4.0–10.5)
nRBC: 0 % (ref 0.0–0.2)

## 2019-08-29 MED ORDER — PANTOPRAZOLE SODIUM 40 MG PO TBEC: 40.0000 mg | DELAYED_RELEASE_TABLET | Freq: Two times a day (BID) | ORAL | 1 refills | Status: DC

## 2019-08-29 MED ORDER — PANTOPRAZOLE SODIUM 40 MG PO TBEC
40.0000 mg | DELAYED_RELEASE_TABLET | Freq: Two times a day (BID) | ORAL | Status: DC
  Administered 2019-08-29: 40 mg via ORAL
  Filled 2019-08-29: qty 1

## 2019-08-29 NOTE — Discharge Summary (Signed)
Physician Discharge Summary  Jessica Yu RUE:[id number] DOB: 02/03/1975 DOA: 08/26/2019  PCP: Adrienne MochaQuinn, Kiera A, PA  Admit date: 08/26/2019 Discharge date: 08/29/2019  Time spent: 35 minutes  Recommendations for Outpatient Follow-up:  1. Needs close follow-up in outpatient setting with Dr. Georgian[facility]rahmbatt of [facility] who is CCed on this note 2. Recommend CBC Chem-12 1 week 3. Recommend outpatient colonoscopy per [facility]-does have a family history of Crohn's disease which may be a part of her presentation  Discharge Diagnoses:  Active Problems:   GI bleed   Discharge Condition: Improved  Diet recommendation: Heart healthy low-salt  Filed Weights   08/26/19 1817 08/28/19 0601 08/29/19 0518  Weight: 88.5 kg 87.1 kg 85.2 kg    History of present illness:  44 year old black female known DU B HTN Admitted 8/13 with dark stools unformed-does not take chronic OTC NSAIDs drink or smoke  GI consulted recommended nuclear medicine scan possible lower GI bleed  Hospital Course:  GI bleed   1. ?  Hemorrhoidal bleed versus lower diverticular bleed a. Tells me he has family history of Crohn's in sister and mother b. We will observe over the course of today on a soft diet and if she does not have large amounts of bleeding I think she can safely be discharged home a.m. 8/15 for outpatient colonoscopy c. She had several other nonspecific stools but her hemoglobin stabilized in the 10 range she never dropped it and never needed a transfusion so it is reasonable for her to get a colonoscopy as an outpatient d. [facility]ologist Dr. Georgiann CockerBrahmbatt was CCed on this note  ?  Allergic reaction             Was given Ativan overnight on 8/13 and developed a little bit of discomfort in her throat I am giving herself because she has had absolutely no hives no rash or wheezing and I have instructed nursing not to redose any further as needed meds of this nature  HTN is quite stable on her usual home  regimen but she will need labs in a week given she had a little bit of dehydration  Procedures:  Nuclear medicine scan was negative for bleed (i.e. Studies not automatically included, echos, thoracentesis, etc; not x-rays)  Consultations:  [facility] Dr. Georgiann CockerBrahmbatt  Discharge Exam: Vitals:   08/28/19 2022 08/29/19 0518  BP: 117/80 136/85  Pulse: 79 81  Resp: 20 18  Temp: 98 F (36.7 C) 98.1 F (36.7 C)  SpO2: 100% 99%    General: Awake alert standing at the sink brushing her teeth not syncopal no chest pain no fever Cardiovascular: S1-S2 no murmur rub or gallop Respiratory: Clear no added sound Soft no rebound   Discharge Instructions   Discharge Instructions    Diet - low sodium heart healthy   Complete by: As directed    Discharge instructions   Complete by: As directed    Please ensure that you take Protonix twice a day for nonspecific bleeding-remember that we discussed with you that the bleeding scan is completely normal see you do not need an emergent colonoscopy but you will need to be [facility] Dr. Georgiann CockerBrahmbatt in the next 2 weeks or so to set up an elective 1 with the prep I would suggest also that you continue regular diet but if you have what you find to be large amounts of bleeding, then you need to get a regular blood draw to determine if it is close to your  baseline of 10-different things can cause dark stool which is the reason why we have not started you on iron as iron can obfuscated the issue Please follow-up with your primary care physician once again for labs in about a week   Increase activity slowly   Complete by: As directed      Allergies as of 08/29/2019      Reactions   Neomycin Hives, Swelling   Other reaction(s): GI Intolerance, GI Upset (intolerance) Other reaction(s): GI Upset (intolerance) Other reaction(s): GI Upset (intolerance) Other reaction(s): GI Upset (intolerance) Other reaction(s): GI Upset  (intolerance)   Hydromorphone Nausea And Vomiting      Medication List    TAKE these medications   amLODipine 10 MG tablet Commonly known as: NORVASC Take 10 mg by mouth daily.   hydrochlorothiazide 25 MG tablet Commonly known as: HYDRODIURIL Take 25 mg by mouth daily.   pantoprazole 40 MG tablet Commonly known as: PROTONIX Take 1 tablet (40 mg total) by mouth 2 (two) times daily.      Allergies  Allergen Reactions  . Neomycin Hives and Swelling    Other reaction(s): GI Intolerance, GI Upset (intolerance) Other reaction(s): GI Upset (intolerance) Other reaction(s): GI Upset (intolerance) Other reaction(s): GI Upset (intolerance) Other reaction(s): GI Upset (intolerance)   . Hydromorphone Nausea And Vomiting      The results of significant diagnostics from this hospitalization (including imaging, microbiology, ancillary and laboratory) are listed below for reference.    Significant Diagnostic Studies: NM GI Blood Loss  Result Date: 08/28/2019 CLINICAL DATA:  44 year old female GI bleeding EXAM: NUCLEAR MEDICINE GASTROINTESTINAL BLEEDING SCAN TECHNIQUE: Sequential abdominal images were obtained following intravenous administration of Tc-51m labeled red blood cells. RADIOPHARMACEUTICALS:  22.8 mCi Tc-41m pertechnetate in-vitro labeled red cells. COMPARISON:  None. FINDINGS: Planar images of the abdomen/pelvis were performed after administration of the radiotracer. Adequate blood pool images, with accumulation of excreted radiotracer within the urinary bladder. After 2 hours of planar imaging, there is no evidence of accumulation in the GI system. IMPRESSION: Negative GI bleeding study. Electronically Signed   By: Gilmer Mor D.O.   On: 08/28/2019 14:51   CT ABDOMEN PELVIS W CONTRAST  Result Date: 08/27/2019 CLINICAL DATA:  44 year old female with episodes of hematochezia. EXAM: CT ABDOMEN AND PELVIS WITH CONTRAST TECHNIQUE: Multidetector CT imaging of the abdomen and pelvis  was performed using the standard protocol following bolus administration of intravenous contrast. CONTRAST:  OMNIPAQUE IOHEXOL 300 MG/ML  SOLN COMPARISON:  [facility] Noncontrast CT Abdomen and Pelvis 11/02/2013 FINDINGS: Lower chest: Cardiac size is at the upper limits of normal. Stable mild elevation of the right hemidiaphragm. Negative lung bases. Hepatobiliary: Negative liver and gallbladder. Pancreas: Negative. Spleen: Negative. Adrenals/Urinary Tract: Normal adrenal glands. Symmetric renal enhancement and contrast excretion. Normal proximal ureters. No nephrolithiasis. Tiny benign left renal lower pole cysts suspected. Diminutive and unremarkable urinary bladder. Chronic pelvic phleboliths. Stomach/Bowel: Negative rectum and distal sigmoid. The mid and proximal sigmoid is mildly redundant with mild to moderate diverticulosis, but no active inflammation. There is diverticulosis throughout the descending colon, but no active inflammation. Diverticulosis throughout the transverse colon and ascending colon, with no areas of active inflammation identified. The cecum is on a lax mesentery located in the right upper quadrant. Normal appendix on coronal image 71. Negative terminal ileum. No dilated small bowel. Decompressed stomach and duodenum. No free air, free fluid. Vascular/Lymphatic: Major arterial structures are patent. Minimal iliac atherosclerosis. Portal venous system appears patent. No lymphadenopathy.  Reproductive: Negative, small physiologic left ovarian cysts suspected on series 2, image 63. Other: No pelvic free fluid. Musculoskeletal: Mild grade 1 anterolisthesis has developed at L4-L5 since 2015, with associated facet arthropathy. No pars fracture. No acute osseous abnormality identified. IMPRESSION: 1. Severe large bowel diverticulosis. No areas of active inflammation identified, but favor diverticular disease related lower GI bleeding in this setting. Nuclear Medicine Tagged  RBC scan has the highest sensitivity for active GI bleeding. 2. Otherwise no acute or inflammatory process identified in the abdomen or pelvis. 3. Mild grade 1 anterolisthesis at L4-L5 has developed since 2015 with associated facet arthropathy. Electronically Signed   By: Odessa Fleming M.D.   On: 08/27/2019 02:08    Microbiology: Recent Results (from the past 240 hour(s))  SARS Coronavirus 2 by RT PCR (hospital order, performed in [facility] hospital lab) Nasopharyngeal Nasopharyngeal Swab     Status: None   Collection Time: 08/27/19  2:52 AM   Specimen: Nasopharyngeal Swab  Result Value Ref Range Status   SARS Coronavirus 2 NEGATIVE NEGATIVE Final    Comment: (NOTE) SARS-CoV-2 target nucleic acids are NOT DETECTED.  The SARS-CoV-2 RNA is generally detectable in upper and lower respiratory specimens during the acute phase of infection. The lowest concentration of SARS-CoV-2 viral copies this assay can detect is 250 copies / mL. A negative result does not preclude SARS-CoV-2 infection and should not be used as the sole basis for treatment or other patient management decisions.  A negative result may occur with improper specimen collection / handling, submission of specimen other than nasopharyngeal swab, presence of viral mutation(s) within the areas targeted by this assay, and inadequate number of viral copies (<250 copies / mL). A negative result must be combined with clinical observations, patient history, and epidemiological information.  Fact Sheet for Patients:   BoilerBrush.com.cy  Fact Sheet for Healthcare Providers: https://pope.com/  This test is not yet approved or  cleared by the [place] FDA and has been authorized for detection and/or diagnosis of SARS-CoV-2 by FDA under an Emergency Use Authorization (EUA).  This EUA will remain in effect (meaning this test can be used) for the duration of the COVID-19 declaration under  Section 564(b)(1) of the Act, 21 U.S.C. section 360bbb-3(b)(1), unless the authorization is terminated or revoked sooner.  Performed at [facility], [address], [place], [place] [address]      Labs: Basic Metabolic Panel: Recent Labs  Lab 08/26/19 1855 08/27/19 0431 08/28/19 0346 08/29/19 0416  NA 140 139 137 138  K 3.9 4.1 3.4* 3.6  CL 106 109 104 104  CO2 26 23 25 27   GLUCOSE 101* 120* 101* 97  BUN 14 13 9 12   CREATININE 0.97 0.89 0.80 1.01*  CALCIUM 8.9 8.1* 8.2* 8.7*  MG  --  1.9  --   --    Liver Function Tests: Recent Labs  Lab 08/26/19 1855 08/27/19 0431 08/28/19 0346 08/29/19 0416  AST 17 15 16 15   ALT 15 16 16 15   ALKPHOS 46 36* 38 39  BILITOT 0.5 0.5 0.6 0.3  PROT 7.1 6.5 6.4* 6.7  ALBUMIN 4.2 3.7 3.7 3.7   No results for input(s): LIPASE, AMYLASE in the last 168 hours. No results for input(s): AMMONIA in the last 168 hours. CBC: Recent Labs  Lab 08/26/19 1855 08/26/19 1855 08/27/19 0036 08/27/19 0431 08/27/19 1125 08/27/19 1633 08/27/19 2256 08/28/19 0346 08/29/19 0416  WBC 9.0  --  10.9*  --   --   --   --  6.4 6.6  NEUTROABS  --   --  7.8*  --   --   --   --  3.0 3.0  HGB 12.3   < > 11.7*   < > 9.8* 9.8* 9.5* 9.7* 10.1*  HCT 38.4   < > 36.2   < > 30.4* 30.3* 29.7* 30.6* 31.7*  MCV 87.7  --  87.4  --   --   --   --  88.4 87.3  PLT 281  --  271  --   --   --   --  235 269   < > = values in this interval not displayed.   Cardiac Enzymes: No results for input(s): CKTOTAL, CKMB, CKMBINDEX, TROPONINI in the last 168 hours. BNP: BNP (last 3 results) No results for input(s): BNP in the last 8760 hours.  ProBNP (last 3 results) No results for input(s): PROBNP in the last 8760 hours.  CBG: No results for input(s): GLUCAP in the last 168 hours.     Signed:  Rhetta Mura MD   [facility] 08/29/2019, 9:13 AM

## 2019-09-09 ENCOUNTER — Ambulatory Visit (INDEPENDENT_AMBULATORY_CARE_PROVIDER_SITE_OTHER): Payer: Self-pay | Admitting: Family Medicine

## 2019-09-14 ENCOUNTER — Ambulatory Visit (INDEPENDENT_AMBULATORY_CARE_PROVIDER_SITE_OTHER): Payer: Self-pay | Admitting: Family Medicine

## 2019-09-28 ENCOUNTER — Ambulatory Visit (INDEPENDENT_AMBULATORY_CARE_PROVIDER_SITE_OTHER): Payer: Self-pay | Admitting: Family Medicine

## 2019-10-26 ENCOUNTER — Encounter (INDEPENDENT_AMBULATORY_CARE_PROVIDER_SITE_OTHER): Payer: Self-pay

## 2019-12-17 ENCOUNTER — Encounter (INDEPENDENT_AMBULATORY_CARE_PROVIDER_SITE_OTHER): Payer: Self-pay

## 2020-01-15 HISTORY — PX: KNEE ARTHROSCOPY: SUR90

## 2021-01-04 DIAGNOSIS — S83241A Other tear of medial meniscus, current injury, right knee, initial encounter: Secondary | ICD-10-CM | POA: Insufficient documentation

## 2021-06-18 ENCOUNTER — Emergency Department
Admission: EM | Admit: 2021-06-18 | Discharge: 2021-06-18 | Disposition: A | Discharge status: Home / Self Care | Payer: Managed Care, Other (non HMO) | Attending: Emergency Medicine | Admitting: Emergency Medicine

## 2021-06-18 ENCOUNTER — Encounter: Payer: Self-pay | Admitting: Emergency Medicine

## 2021-06-18 ENCOUNTER — Other Ambulatory Visit: Payer: Self-pay

## 2021-06-18 DIAGNOSIS — I10 Essential (primary) hypertension: Secondary | ICD-10-CM | POA: Diagnosis not present

## 2021-06-18 DIAGNOSIS — T782XXA Anaphylactic shock, unspecified, initial encounter: Secondary | ICD-10-CM | POA: Diagnosis not present

## 2021-06-18 DIAGNOSIS — Z79899 Other long term (current) drug therapy: Secondary | ICD-10-CM | POA: Insufficient documentation

## 2021-06-18 DIAGNOSIS — L509 Urticaria, unspecified: Secondary | ICD-10-CM | POA: Diagnosis present

## 2021-06-18 MED ORDER — EPINEPHRINE 0.3 MG/0.3ML IJ SOAJ
0.3000 mg | Freq: Once | INTRAMUSCULAR | Status: AC
  Administered 2021-06-18: 0.3 mg via INTRAMUSCULAR

## 2021-06-18 MED ORDER — METHYLPREDNISOLONE SODIUM SUCC 125 MG IJ SOLR
125.0000 mg | Freq: Once | INTRAMUSCULAR | Status: AC
  Administered 2021-06-18: 125 mg via INTRAVENOUS

## 2021-06-18 MED ORDER — DIPHENHYDRAMINE HCL 50 MG/ML IJ SOLN
25.0000 mg | Freq: Once | INTRAMUSCULAR | Status: AC
  Administered 2021-06-18: 25 mg via INTRAVENOUS

## 2021-06-18 MED ORDER — SODIUM CHLORIDE 0.9 % IV BOLUS
1000.0000 mL | Freq: Once | INTRAVENOUS | Status: AC
  Administered 2021-06-18: 1000 mL via INTRAVENOUS

## 2021-06-18 MED ORDER — PREDNISONE 20 MG PO TABS: 40.0000 mg | ORAL_TABLET | Freq: Every day | ORAL | 0 refills | Status: AC

## 2021-06-18 MED ORDER — EPINEPHRINE 0.3 MG/0.3ML IJ SOAJ: 0.3000 mg | INTRAMUSCULAR | 1 refills | Status: AC | PRN

## 2021-06-18 MED ORDER — FAMOTIDINE IN NACL 20-0.9 MG/50ML-% IV SOLN
20.0000 mg | Freq: Once | INTRAVENOUS | Status: AC
  Administered 2021-06-18: 20 mg via INTRAVENOUS

## 2021-06-18 NOTE — Discharge Instructions (Addendum)
Start the steroids tomorrow.  You can take Benadryl or allegra to help with symptoms.   Use the EpiPen for future reaction that is severe

## 2021-06-18 NOTE — ED Triage Notes (Signed)
Pt states look losartan for the first time today and has developed rash throughout, itching, and tightening to throat. Pt does have a raspy voice which is not normal for her.

## 2021-06-18 NOTE — ED Provider Triage Note (Signed)
Emergency Medicine Provider Triage Evaluation Note  Jessica Yu , a 46 y.o. female  was evaluated in triage.  Pt complains of presents emergency department with allergic reaction.  Patient took losartan and is now having itching in her throat, rash, some throat tightness.  No chest pain shortness of breath..  Review of Systems  Positive: Allergic reaction Negative: Chest pain shortness of breath  Physical Exam  There were no vitals taken for this visit. Gen:   Awake, no distress   Resp:  Normal effort 1 anterior wheeze noted MSK:   Moves extremities without difficulty  Other:    Medical Decision Making  Medically screening exam initiated at 11:04 AM.  Appropriate orders placed.  Jessica Yu was informed that the remainder of the evaluation will be completed by another provider, this initial triage assessment does not replace that evaluation, and the importance of remaining in the ED until their evaluation is complete.  Allergy protocols initiated   Versie Starks, PA-C 06/18/21 1107

## 2021-06-18 NOTE — ED Provider Notes (Addendum)
[facility] Provider Note    Event Date/Time   First MD Initiated Contact with Patient 06/18/21 1134     (approximate)   History   Allergic Reaction   HPI  Jessica Yu is a 46 y.o. female with hypertension who comes in with concern for allergic reaction.  Patient reports that she was just started on losartan and she took her first dose on Saturday missed it yesterday and then took a dose this morning and about an hour later she developed hives, itchiness and feeling like her throat is tightening.  She reports that even after medications given in triage her voice is still very hoarse and feels tight in nature.  She denies any shortness of breath or chest pain.  She reports that her hives have gotten better.  She denies any other new medications, insect bites, new foods.  Physical Exam   Triage Vital Signs: ED Triage Vitals  Enc Vitals Group     BP 06/18/21 1104 (!) 201/125     Pulse Rate 06/18/21 1104 81     Resp 06/18/21 1104 20     Temp 06/18/21 1104 98.5 F (36.9 C)     Temp Source 06/18/21 1104 Oral     SpO2 06/18/21 1104 98 %     Weight 06/18/21 1105 180 lb (81.6 kg)     Height 06/18/21 1105 5\' 2"  (1.575 m)     Head Circumference --      Peak Flow --      Pain Score 06/18/21 1104 0     Pain Loc --      Pain Edu? --      Excl. in GC? --     Most recent vital signs: Vitals:   06/18/21 1104  BP: (!) 201/125  Pulse: 81  Resp: 20  Temp: 98.5 F (36.9 C)  SpO2: 98%     General: Awake, no distress.  CV:  Good peripheral perfusion.  Resp:  Normal effort.  Abd:  No distention.  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  No obvious swelling in the face oropharynx appears clear without any tongue swelling.  She is got some resolving hives noted to her skin.  Her voice does sound hoarse in nature but she is tolerating secretions   ED Results /  Procedures / Treatments   Labs (all labs ordered are listed, but only abnormal results are displayed) Labs Reviewed - No data to display      PROCEDURES:  Critical Care performed: Yes, see critical care procedure note(s)  .Critical Care Performed by: 08/18/21, MD Authorized by: Concha Se, MD   Critical care provider statement:    Critical care time (minutes):  30   Critical care was necessary to treat or prevent imminent or life-threatening deterioration of the following conditions: anaphylasix.   Critical care was time spent personally by me on the following activities:  Development of treatment plan with patient or surrogate, discussions with consultants, evaluation of patient's response to treatment, examination of patient, ordering and review of laboratory studies, ordering and review of radiographic studies, ordering and performing treatments and interventions, pulse oximetry, re-evaluation of patient's condition and review of old charts   MEDICATIONS ORDERED IN ED: Medications  EPINEPHrine (EPI-PEN) injection 0.3 mg (has no administration in time range)  diphenhydrAMINE (BENADRYL) injection 25 mg (has no administration in time range)  sodium chloride 0.9 % bolus 1,000 mL (1,000 mLs Intravenous New Bag/Given 06/18/21 1115)  methylPREDNISolone sodium succinate (SOLU-MEDROL) 125 mg/2 mL injection 125 mg (125 mg Intravenous Given 06/18/21 1114)  diphenhydrAMINE (BENADRYL) injection 25 mg (25 mg Intravenous Given 06/18/21 1114)  famotidine (PEPCID) IVPB 20 mg premix (20 mg Intravenous New Bag/Given 06/18/21 1114)     IMPRESSION / MDM / ASSESSMENT AND PLAN / ED COURSE  I reviewed the triage vital signs and the nursing notes.   Patient's presentation is most consistent with acute presentation with potential threat to life or bodily function.   Given patient continues to have hoarse voice even after initial allergy medications I discussed patient further about epinephrine.   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 Her exam is concerning for anaphylaxis versus allergic reaction.  No evidence of strep throat   12:45 PM reassessment after the epinephrine patient's voice is far less hoarse and feeling much better.  Patient will follow-up for recheck of blood pressure with her PCP and decide further management of this but in the meantime she can hold off on the losartan.  Losartan was already placed as an allergy. We will anticipate discharge after 3 hours after epi if patient remains  3:15 PM reevaluated patient and she reports complete resolution of symptoms and feels comfortable with discharge home    FINAL CLINICAL IMPRESSION(S) / ED DIAGNOSES   Final diagnoses:  Anaphylaxis, initial encounter     Rx / DC Orders   ED Discharge Orders          Ordered    predniSONE (DELTASONE) 20 MG tablet  Daily with breakfast        06/18/21 1247    EPINEPHrine 0.3 mg/0.3 mL IJ SOAJ injection  As needed        06/18/21 1247             Note:  This document was prepared using Dragon voice recognition software and may include unintentional dictation errors.   Concha Se, MD 06/18/21 1251    Concha Se, MD 06/18/21 1515

## 2021-06-18 NOTE — ED Notes (Signed)
Pt started losartan this am and states she felt itchy and developed hives throughout. States is not shob but does feel tightening to throat.

## 2021-07-06 IMAGING — CT CT ABD-PELV W/ CM
2 of 8 series · 14 of 46 positions shown, 18 images · IV contrast (OMNIPAQUE 300)
Comparison: [REDACTED] Noncontrast CT Abdomen and
Pelvis 11/02/2013

CLINICAL DATA: 44-year-old female with episodes of hematochezia.

EXAM:
CT ABDOMEN AND PELVIS WITH CONTRAST
TECHNIQUE: Multidetector CT imaging of the abdomen and pelvis was performed
using the standard protocol following bolus administration of
intravenous contrast.
CONTRAST:  100mL OMNIPAQUE IOHEXOL 300 MG/ML  SOLN

[Series 2: axial st · axial · 0.68mm/px · z∈[+1192,+1542]mm · 11 of 82 slices shown, 15 images]
[im 6/82  soft-tissue]
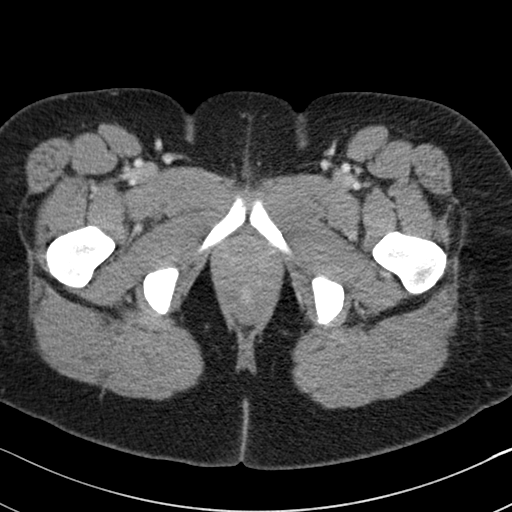
[im 6/82  bone]
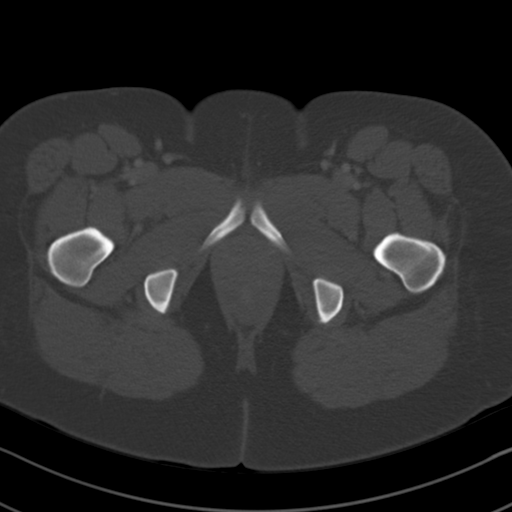
[im 16/82  soft-tissue]
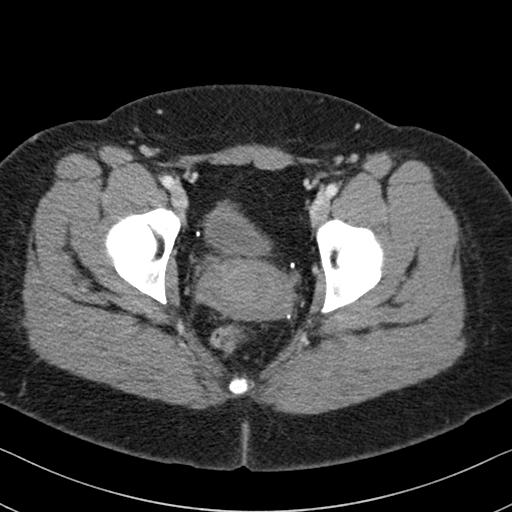
[im 26/82  soft-tissue]
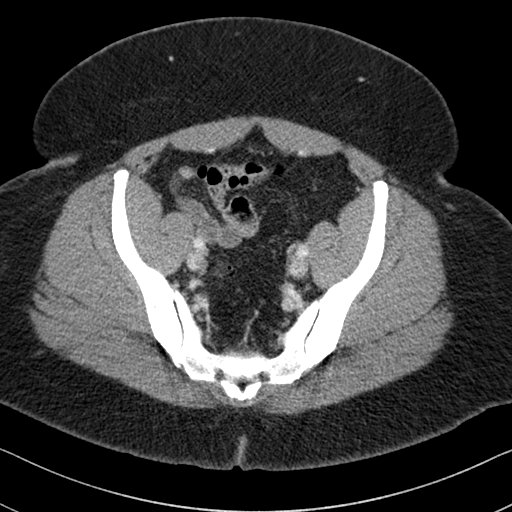
[im 31/82  soft-tissue]
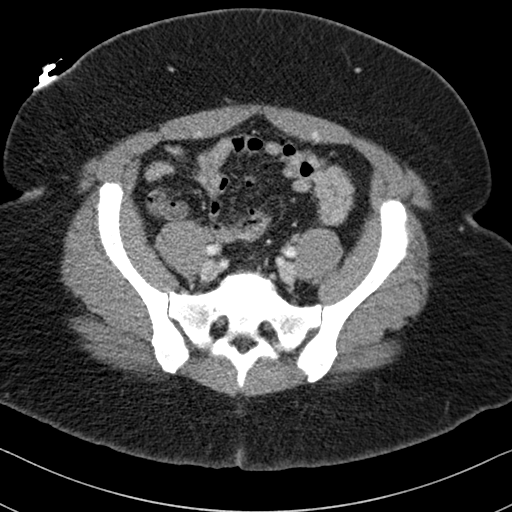
[im 41/82  soft-tissue]
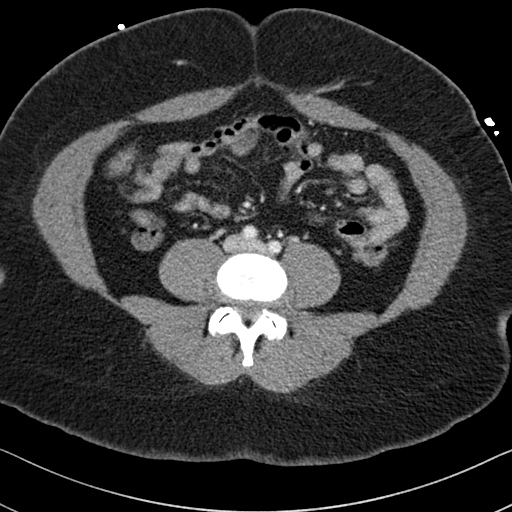
[im 51/82  soft-tissue]
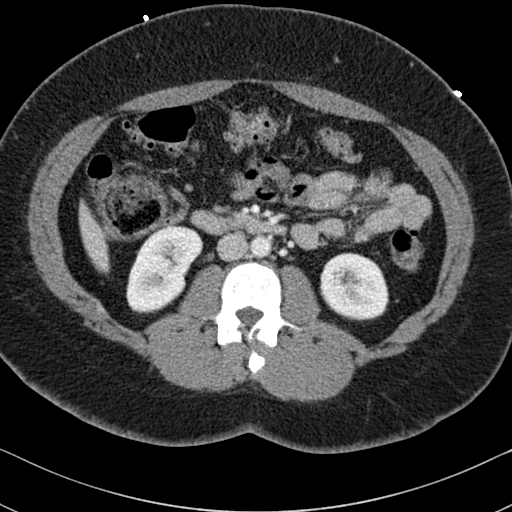
[im 56/82  soft-tissue]
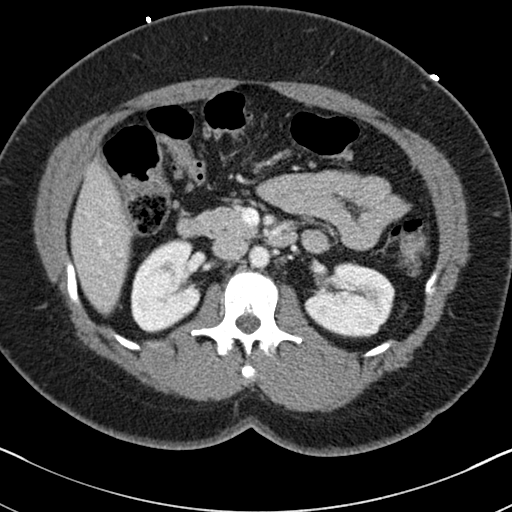
[im 61/82  lung]
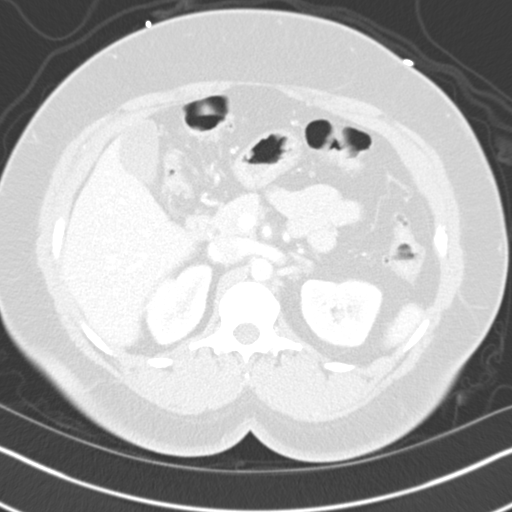
[im 66/82  soft-tissue]
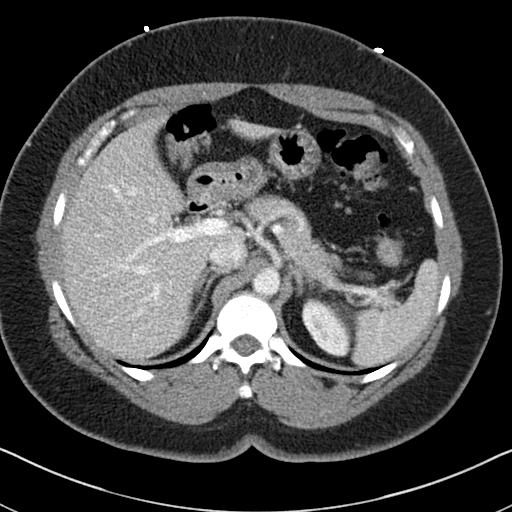
[im 66/82  lung]
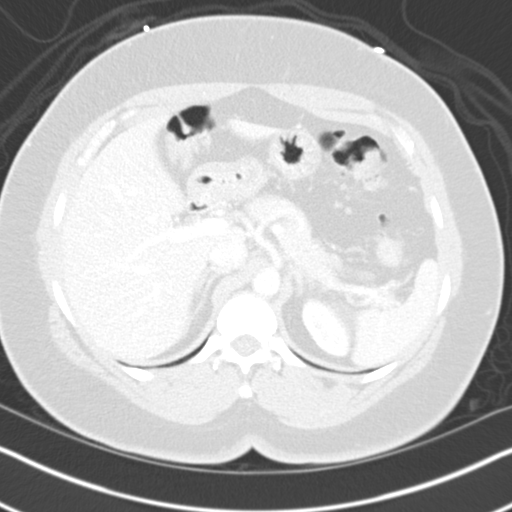
[im 71/82  lung]
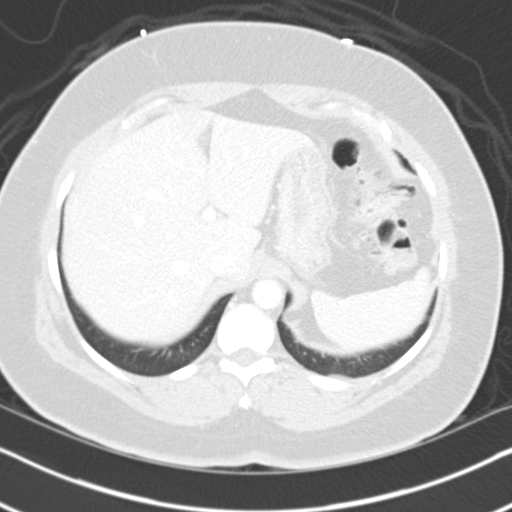
[im 76/82  soft-tissue]
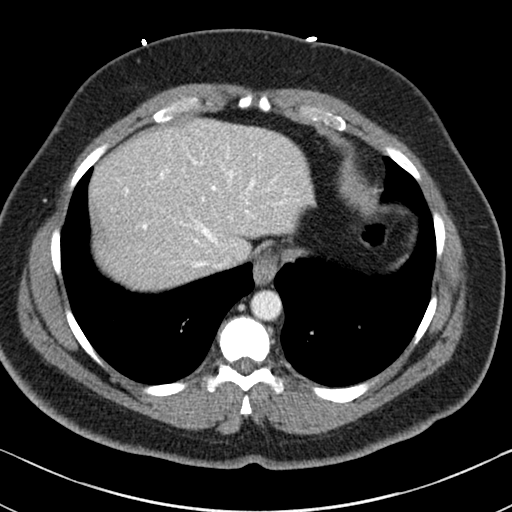
[im 76/82  lung]
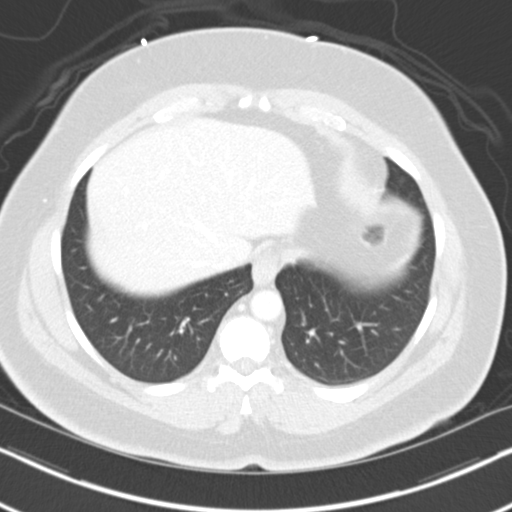
[im 76/82  bone]
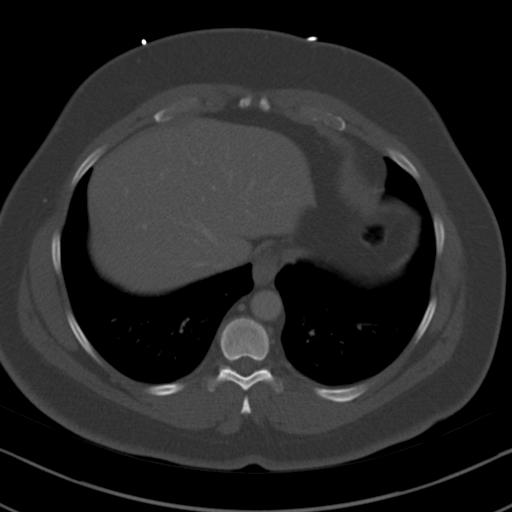

[Series 6: coronal st · coronal · 0.77mm/px · 3 of 148 slices shown]
[im 37/148  soft-tissue]
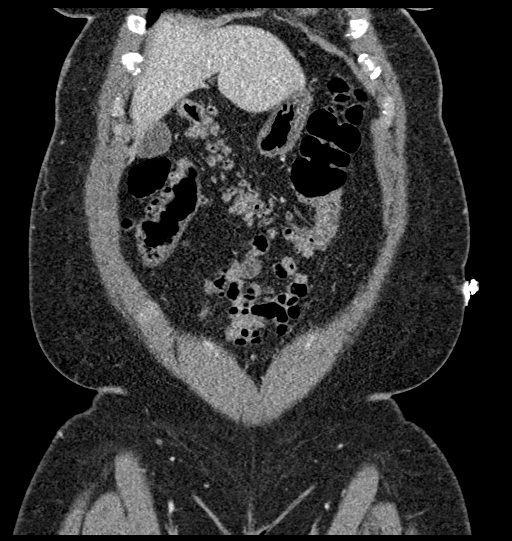
[im 74/148  soft-tissue]
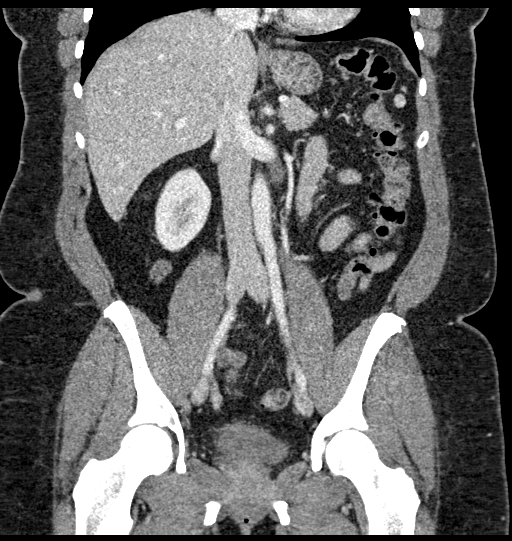
[im 111/148  soft-tissue]
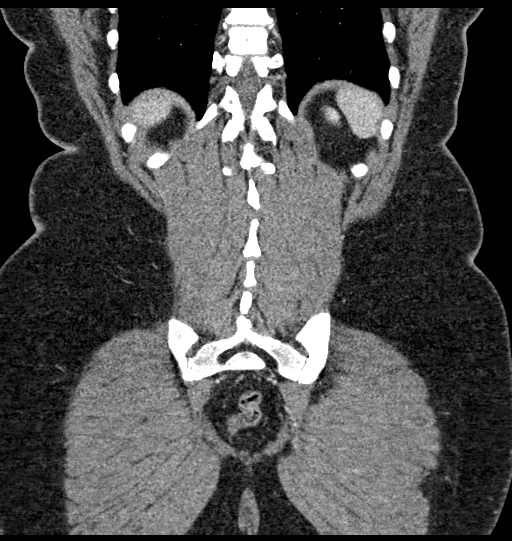

[14 of 46 positions shown; findings below may reference images not displayed]

FINDINGS: Lower chest: Cardiac size is at the upper limits of normal. Stable
mild elevation of the right hemidiaphragm. Negative lung bases.

Hepatobiliary: Negative liver and gallbladder.

Pancreas: Negative.

Spleen: Negative.

Adrenals/Urinary Tract: Normal adrenal glands.

Symmetric renal enhancement and contrast excretion. Normal proximal
ureters. No nephrolithiasis. Tiny benign left renal lower pole cysts
suspected. Diminutive and unremarkable urinary bladder. Chronic
pelvic phleboliths.

Stomach/Bowel: Negative rectum and distal sigmoid. The mid and
proximal sigmoid is mildly redundant with mild to moderate
diverticulosis, but no active inflammation.

There is diverticulosis throughout the descending colon, but no
active inflammation. Diverticulosis throughout the transverse colon
and ascending colon, with no areas of active inflammation
identified. The cecum is on a lax mesentery located in the right
upper quadrant. Normal appendix on coronal image 71. Negative
terminal ileum.

No dilated small bowel. Decompressed stomach and duodenum. No free
air, free fluid.

Vascular/Lymphatic: Major arterial structures are patent. Minimal
iliac atherosclerosis. Portal venous system appears patent. No
lymphadenopathy.

Reproductive: Negative, small physiologic left ovarian cysts
suspected on series 2, image 63.

Other: No pelvic free fluid.

Musculoskeletal: Mild grade 1 anterolisthesis has developed at L4-L5
since 3187, with associated facet arthropathy. No pars fracture. No
acute osseous abnormality identified.
IMPRESSION: 1. Severe large bowel diverticulosis. No areas of active
inflammation identified, but favor diverticular disease related
lower GI bleeding in this setting. Nuclear Medicine Tagged RBC scan
has the highest sensitivity for active GI bleeding.

2. Otherwise no acute or inflammatory process identified in the
abdomen or pelvis.

3. Mild grade 1 anterolisthesis at L4-L5 has developed since 3187
with associated facet arthropathy.

## 2021-08-22 ENCOUNTER — Encounter (INDEPENDENT_AMBULATORY_CARE_PROVIDER_SITE_OTHER): Payer: Self-pay

## 2022-06-01 ENCOUNTER — Encounter (HOSPITAL_COMMUNITY): Payer: Self-pay | Admitting: Internal Medicine

## 2022-06-01 ENCOUNTER — Other Ambulatory Visit: Payer: Self-pay

## 2022-06-01 ENCOUNTER — Observation Stay (HOSPITAL_BASED_OUTPATIENT_CLINIC_OR_DEPARTMENT_OTHER)
Admission: EM | Admit: 2022-06-01 | Discharge: 2022-06-03 | Disposition: A | Discharge status: Home / Self Care | Payer: Managed Care, Other (non HMO) | Attending: Internal Medicine | Admitting: Internal Medicine

## 2022-06-01 DIAGNOSIS — I10 Essential (primary) hypertension: Secondary | ICD-10-CM | POA: Diagnosis not present

## 2022-06-01 DIAGNOSIS — Z79899 Other long term (current) drug therapy: Secondary | ICD-10-CM | POA: Insufficient documentation

## 2022-06-01 DIAGNOSIS — E66811 Obesity, class 1: Secondary | ICD-10-CM | POA: Diagnosis present

## 2022-06-01 DIAGNOSIS — R109 Unspecified abdominal pain: Secondary | ICD-10-CM | POA: Diagnosis present

## 2022-06-01 DIAGNOSIS — Z6834 Body mass index (BMI) 34.0-34.9, adult: Secondary | ICD-10-CM | POA: Diagnosis not present

## 2022-06-01 DIAGNOSIS — K922 Gastrointestinal hemorrhage, unspecified: Secondary | ICD-10-CM | POA: Diagnosis not present

## 2022-06-01 DIAGNOSIS — E669 Obesity, unspecified: Secondary | ICD-10-CM | POA: Insufficient documentation

## 2022-06-01 LAB — COMPREHENSIVE METABOLIC PANEL
ALT: 19 U/L (ref 0–44)
AST: 20 U/L (ref 15–41)
Albumin: 4.4 g/dL (ref 3.5–5.0)
Alkaline Phosphatase: 53 U/L (ref 38–126)
Anion gap: 10 (ref 5–15)
BUN: 17 mg/dL (ref 6–20)
CO2: 29 mmol/L (ref 22–32)
Calcium: 9.4 mg/dL (ref 8.9–10.3)
Chloride: 101 mmol/L (ref 98–111)
Creatinine, Ser: 1.05 mg/dL — ABNORMAL HIGH (ref 0.44–1.00)
GFR, Estimated: >60 mL/min (ref >60)
Glucose, Bld: 90 mg/dL (ref 70–99)
Potassium: 3.6 mmol/L (ref 3.5–5.1)
Sodium: 140 mmol/L (ref 135–145)
Total Bilirubin: 0.3 mg/dL (ref 0.3–1.2)
Total Protein: 7.2 g/dL (ref 6.5–8.1)

## 2022-06-01 LAB — CBC
HCT: 40 % (ref 36.0–46.0)
Hemoglobin: 12.9 g/dL (ref 12.0–15.0)
MCH: 27.8 pg (ref 26.0–34.0)
MCHC: 32.3 g/dL (ref 30.0–36.0)
MCV: 86.2 fL (ref 80.0–100.0)
Platelets: 309 10*3/uL (ref 150–400)
RBC: 4.64 MIL/uL (ref 3.87–5.11)
RDW: 14.4 % (ref 11.5–15.5)
WBC: 8.3 10*3/uL (ref 4.0–10.5)
nRBC: 0 % (ref 0.0–0.2)

## 2022-06-01 LAB — HEMOGLOBIN AND HEMATOCRIT, BLOOD
HCT: 34.9 % — ABNORMAL LOW (ref 36.0–46.0)
HCT: 37.5 % (ref 36.0–46.0)
HCT: 36 % (ref 36.0–46.0)
Hemoglobin: 11.3 g/dL — ABNORMAL LOW (ref 12.0–15.0)
Hemoglobin: 11.7 g/dL — ABNORMAL LOW (ref 12.0–15.0)
Hemoglobin: 11.8 g/dL — ABNORMAL LOW (ref 12.0–15.0)

## 2022-06-01 LAB — OCCULT BLOOD X 1 CARD TO LAB, STOOL: Fecal Occult Bld: POSITIVE — AB

## 2022-06-01 LAB — TYPE AND SCREEN
ABO/RH(D): O POS
Antibody Screen: NEGATIVE

## 2022-06-01 MED ORDER — ACETAMINOPHEN 325 MG PO TABS
650.0000 mg | ORAL_TABLET | Freq: Four times a day (QID) | ORAL | Status: DC | PRN
  Administered 2022-06-01 – 2022-06-03 (×5): 650 mg via ORAL
  Filled 2022-06-01 (×5): qty 2

## 2022-06-01 MED ORDER — POTASSIUM CHLORIDE IN NACL 20-0.9 MEQ/L-% IV SOLN
INTRAVENOUS | Status: DC
  Filled 2022-06-01 (×6): qty 1000

## 2022-06-01 MED ORDER — ONDANSETRON HCL 4 MG/2ML IJ SOLN
4.0000 mg | Freq: Once | INTRAMUSCULAR | Status: AC
  Administered 2022-06-01: 4 mg via INTRAVENOUS
  Filled 2022-06-01: qty 2

## 2022-06-01 MED ORDER — HYDRALAZINE HCL 20 MG/ML IJ SOLN
10.0000 mg | INTRAMUSCULAR | Status: DC | PRN
  Administered 2022-06-02: 10 mg via INTRAVENOUS
  Filled 2022-06-01: qty 1

## 2022-06-01 MED ORDER — ONDANSETRON HCL 4 MG PO TABS: 4.0000 mg | ORAL_TABLET | Freq: Four times a day (QID) | ORAL | Status: DC | PRN

## 2022-06-01 MED ORDER — FAMOTIDINE IN NACL 20-0.9 MG/50ML-% IV SOLN
20.0000 mg | Freq: Two times a day (BID) | INTRAVENOUS | Status: DC
  Administered 2022-06-01 – 2022-06-02 (×3): 20 mg via INTRAVENOUS
  Filled 2022-06-01 (×3): qty 50

## 2022-06-01 MED ORDER — SODIUM CHLORIDE 0.9 % IV BOLUS
1000.0000 mL | Freq: Once | INTRAVENOUS | Status: AC
  Administered 2022-06-01: 1000 mL via INTRAVENOUS

## 2022-06-01 MED ORDER — ACETAMINOPHEN 650 MG RE SUPP: 650.0000 mg | Freq: Four times a day (QID) | RECTAL | Status: DC | PRN

## 2022-06-01 MED ORDER — AMLODIPINE BESYLATE 10 MG PO TABS
10.0000 mg | ORAL_TABLET | Freq: Every day | ORAL | Status: DC
  Administered 2022-06-02 – 2022-06-03 (×2): 10 mg via ORAL
  Filled 2022-06-01 (×2): qty 1

## 2022-06-01 MED ORDER — ONDANSETRON HCL 4 MG/2ML IJ SOLN: 4.0000 mg | Freq: Four times a day (QID) | INTRAMUSCULAR | Status: DC | PRN

## 2022-06-01 MED ORDER — AMLODIPINE BESYLATE 5 MG PO TABS
10.0000 mg | ORAL_TABLET | Freq: Once | ORAL | Status: AC
  Administered 2022-06-01: 10 mg via ORAL
  Filled 2022-06-01: qty 2

## 2022-06-01 NOTE — Progress Notes (Signed)
Hospitalist Transfer Note:  Transferring facility: [facility] Requesting provider: Dr. Eudelia Bunch (EDP at [facility]) Reason for transfer: admission for further evaluation and management of acute lower GI bleed.    47 y.o.  female, with history of diverticulosis with prior lower GI bleed in 2022, who presented to [facility] ED complaining of 1 to 2 days of intermittent bright red blood per rectum.  Not associate with any abdominal pain, fever.  Not on any blood thinners or NSAIDs.  No known history of underlying liver disease.  Has some mild dizziness, in the absence of presyncope, syncope, or fall.  Denies any associated nausea, vomiting.  No melena.  No chest pain or shortness of breath.  She conveys that she followed with [facility] gastroenterology at the time of her lower gastrointestinal bleed in 2022.  Vital signs in the ED were notable for the following: Systolic blood pressures in the 150s to 160s.  Heart rates in the 70s to 80s.  Labs were notable for hemoglobin 12.9 compared to most recent prior documented hemoglobin of 10.1 in August 2021.  BUN 17.  Of note, case has not yet been discussed with GI. [facility] noted to be on-call at [facility] today.  Subsequently, I accepted this patient for transfer for observation to a med/tele bed at [facility] long for further work-up and management of the above.      Nursing staff, Please call [facility] Admits & Consults System-Wide number on Amion [phone]) as soon as patient's arrival, so appropriate admitting provider can evaluate the pt.     Newton Pigg, DO Hospitalist

## 2022-06-01 NOTE — ED Provider Notes (Signed)
[name] EMERGENCY DEPARTMENT AT [facility] Provider Note  CSN: [id number] Arrival date & time: 06/01/22 0101  Chief Complaint(s) Rectal Bleeding  HPI Jessica Yu is a 47 y.o. female    The history is provided by the patient.  Rectal Bleeding Quality:  Bright red and maroon Amount:  Moderate Duration:  2 hours Timing:  Intermittent Chronicity:  Recurrent Context: spontaneously   Similar prior episodes: yes (h/o diverticulosis)   Relieved by:  Nothing Worsened by:  Nothing Associated symptoms: abdominal pain (achy)   Associated symptoms: no dizziness, no epistaxis, no light-headedness, no loss of consciousness and no vomiting   Risk factors: no anticoagulant use and no NSAID use    7 bouts of blood BM (4 at home). Worse was the first.  Past Medical History Past Medical History:  Diagnosis Date   Hypertension    Patient Active Problem List   Diagnosis Date Noted   GI bleed 08/27/2019   Home Medication(s) Prior to Admission medications   Medication Sig Start Date End Date Taking? Authorizing Provider  amLODipine (NORVASC) 10 MG tablet Take 10 mg by mouth daily. 03/30/19   [provider]  hydrochlorothiazide (HYDRODIURIL) 25 MG tablet Take 25 mg by mouth daily.    [provider]  pantoprazole (PROTONIX) 40 MG tablet Take 1 tablet (40 mg total) by mouth 2 (two) times daily. 08/29/19   Rhetta Mura, MD                                                                                                                                    Allergies Neomycin, Hydromorphone, and Losartan  Review of Systems Review of Systems  HENT:  Negative for nosebleeds.   Gastrointestinal:  Positive for abdominal pain (achy) and hematochezia. Negative for vomiting.  Neurological:  Negative for dizziness, loss of consciousness and light-headedness.   As noted in HPI  Physical Exam Vital Signs  I have reviewed the triage vital signs BP (!)  193/115 (BP Location: Left Arm)   Pulse 84   Temp 98.3 F (36.8 C) (Oral)   Resp 15   Ht 5\' 2"  (1.575 m)   Wt 86.2 kg   SpO2 100%   BMI 34.75 kg/m   Physical Exam Vitals reviewed.  Constitutional:      General: She is not in acute distress.    Appearance: She is well-developed. She is not diaphoretic.  HENT:     Head: Normocephalic and atraumatic.     Right Ear: External ear normal.     Left Ear: External ear normal.     Nose: Nose normal.  Eyes:     General: No scleral icterus.    Conjunctiva/sclera: Conjunctivae normal.  Neck:     Trachea: Phonation normal.  Cardiovascular:     Rate and Rhythm: Normal rate and regular rhythm.  Pulmonary:     Effort: Pulmonary effort is normal. No respiratory distress.  Breath sounds: No stridor.  Abdominal:     General: There is no distension.     Tenderness: There is no abdominal tenderness.  Musculoskeletal:        General: Normal range of motion.     Cervical back: Normal range of motion.  Neurological:     Mental Status: She is alert and oriented to person, place, and time.  Psychiatric:        Behavior: Behavior normal.     ED Results and Treatments Labs (all labs ordered are listed, but only abnormal results are displayed) Labs Reviewed  COMPREHENSIVE METABOLIC PANEL - Abnormal; Notable for the following components:      Result Value   Creatinine, Ser 1.05 (*)    All other components within normal limits  OCCULT BLOOD X 1 CARD TO LAB, STOOL - Abnormal; Notable for the following components:   Fecal Occult Bld POSITIVE (*)    All other components within normal limits  CBC  ABO/RH                                                                                                                         EKG  EKG Interpretation  Date/Time:    Ventricular Rate:    PR Interval:    QRS Duration:   QT Interval:    QTC Calculation:   R Axis:     Text Interpretation:         Radiology No results  found.  Medications Ordered in ED Medications  sodium chloride 0.9 % bolus 1,000 mL (0 mLs Intravenous Stopped 06/01/22 0301)  ondansetron (ZOFRAN) injection 4 mg (4 mg Intravenous Given 06/01/22 0309)  amLODipine (NORVASC) tablet 10 mg (10 mg Oral Given 06/01/22 0419)                                                                                                                                     Procedures Procedures  (including critical care time)  Medical Decision Making / ED Course  Click here for ABCD2, HEART and other calculators  Medical Decision Making Amount and/or Complexity of Data Reviewed Labs: ordered.  Risk Prescription drug management.     This patient presents to the ED for: GI Bleed   Key initial findings: Benign abd exam  Co-morbidities/SDOH that complicate the patient evaluation/care: Known history of diverticulosis with prior diverticular bleeds  Presentation involves an extensive  number of treatment options, and is a complaint that carries with it a high risk of complications and morbidity. The differential diagnosis includes but not limited to:  Likely diverticular bleed.  Unlikely upper GI bleed.  Doubt intra-abdominal inflammatory/infectious process such as diverticulitis.  Doubt aortoenteric fistula. Doubt GYN bleed  Hospitalization considered:  yes  Initial intervention:  IVF   Work up Interpretation and Management:  Cardiac Monitoring/EKG: Telemetry with normal sinus rhythm with rates in the 70s to 80s.  Laboratory Tests ordered listed below with my independent interpretation: CBC with no leukocytosis.  No anemia.  Hemoglobin of 12.9 which is an improvement from hemoglobins from 2 years ago. CMP without significant electrolyte derangements or renal sufficiency.  BUN normal. Hemoccult positive.     ED Course:  Clinical Course as of 06/01/22 0610  Sat Jun 01, 2022  [facility] lower GI bleed score of 12.  Patient on border due to  hemoglobin of 12.9 versus 13 that would have put her in a low risk for rebleed and appropriate for discharge.  Patient was informed and would like to go home if possible.  Plan to monitor patient and reassess. [PC]  [id number] Patient persistently HTN. Given home dose of amlodipine. [PC]  0505 Patient still having bloody BM, but states that it's less than prior. Will determine dispo after the next BM. [PC]  [id number] Patient had another bloody bowel movements.  Reports feeling lightheaded.  Now would prefer to stay.  Will consult hospitalist for admission. [PC]  0605 Spoke with Dr. Arlean Hopping from the hospitalist service who accepted patient for admission [PC]    Clinical Course User Index [PC] Medina Degraffenreid, Amadeo Garnet, MD     Final Clinical Impression(s) / ED Diagnoses Final diagnoses:  Lower GI bleeding           This chart was dictated using voice recognition software.  Despite best efforts to proofread,  errors can occur which can change the documentation meaning.    Nira Conn, MD 06/01/22 [phone]

## 2022-06-01 NOTE — H&P (Signed)
History and Physical    Patient: Jessica Yu ZOX:[id number] DOB: 11-01-1975 DOA: 06/01/2022 DOS: the patient was seen and examined on 06/01/2022 PCP: Jessica Bering, DO  Patient coming from: Home  Chief Complaint:  Chief Complaint  Patient presents with   Rectal Bleeding   HPI: Jessica Yu is a 47 y.o. female with medical history significant of hypertension, class I obesity, right knee meniscal tear, history of GI bleed about 3 years ago who is coming to the emergency department due to multiple episodes of rectal bleeding that were preceded by LLQ pain and foul-smelling flatulence since yesterday.  She has been mildly lightheaded.  She does not take aspirin.  She occasionally may take OTC ibuprofen.  This morning her hematochezia has subsided significantly.  No nausea, vomiting or constipation.  She denied fever, chills, rhinorrhea, sore throat, wheezing or hemoptysis.  No chest pain, palpitations, diaphoresis, PND, orthopnea or pitting edema of the lower extremities. No flank pain, dysuria, frequency or hematuria.  No polyuria, polydipsia, polyphagia or blurred vision.   Lab work: Her CBC showed a white count of 8.3, hemoglobin 12.9 g/dL and platelets 811.  Fecal occult blood was positive.  CMP showed a creatinine of 1.05 mg/dL, the rest of the CMP measurements were normal.  Follow-up H&H was 11.3/34.9.   ED course: Initial vital signs were temperature 98.3 F, pulse 84, respiration 15, BP 193/115 mmHg O2 sat 100% on room air.  Patient received 1000 mL normal saline bolus, ondansetron 4 mg IVP and amlodipine 10 mg p.o.  Review of Systems: As mentioned in the history of present illness. All other systems reviewed and are negative. Past Medical History:  Diagnosis Date   Hypertension    Past Surgical History:  Procedure Laterality Date   EYE SURGERY     HYSTERECTOMY ABDOMINAL WITH SALPINGECTOMY     Social History:  reports that she has never smoked. She has never used  smokeless tobacco. She reports that she does not currently use alcohol. She reports that she does not currently use drugs.  Allergies  Allergen Reactions   Neomycin Hives and Swelling    Other reaction(s): GI Intolerance, GI Upset (intolerance) Other reaction(s): GI Upset (intolerance) Other reaction(s): GI Upset (intolerance) Other reaction(s): GI Upset (intolerance) Other reaction(s): GI Upset (intolerance)    Phenytoin Sodium Extended Other (See Comments) and Nausea And Vomiting    Pt. States she has never taken   Erythromycin Base    Hydromorphone Nausea And Vomiting   Lisinopril Itching    Tongue and mouth blisters   Losartan Swelling   Pantoprazole Other (See Comments)    Throat swelling.   Throat swelling.    No family history on file.  Prior to Admission medications   Medication Sig Start Date End Date Taking? Authorizing Provider  amLODipine (NORVASC) 10 MG tablet Take 10 mg by mouth daily. 03/30/19  Yes [provider]  fluticasone (FLOVENT HFA) 110 MCG/ACT inhaler Inhale 1 puff into the lungs 4 (four) times daily as needed (as needed). 05/01/21  Yes [provider]  hydrochlorothiazide (HYDRODIURIL) 25 MG tablet Take 25 mg by mouth daily.   Yes [provider]    Physical Exam: Vitals:   06/01/22 0500 06/01/22 0530 06/01/22 0545 06/01/22 0610  BP: (!) 152/101  (!) 163/105   Pulse: 73 71 84 81  Resp: 17 10 15 11   Temp:    98 F (36.7 C)  TempSrc:    Oral  SpO2: 96% 98% 99%  98%  Weight:      Height:       Physical Exam Vitals and nursing note reviewed.  Constitutional:      Appearance: She is obese.  HENT:     Head: Normocephalic.     Nose: No rhinorrhea.     Mouth/Throat:     Mouth: Mucous membranes are moist.  Eyes:     General: No scleral icterus.    Pupils: Pupils are equal, round, and reactive to light.  Neck:     Vascular: No JVD.  Cardiovascular:     Rate and Rhythm: Normal rate and regular rhythm.     Heart  sounds: S1 normal and S2 normal.  Pulmonary:     Effort: Pulmonary effort is normal.     Breath sounds: Normal breath sounds.  Abdominal:     General: Bowel sounds are normal. There is no distension.     Palpations: Abdomen is soft.     Tenderness: There is no abdominal tenderness.  Musculoskeletal:     Cervical back: Neck supple.     Right lower leg: No edema.     Left lower leg: No edema.  Skin:    General: Skin is warm and dry.  Neurological:     General: No focal deficit present.     Mental Status: She is alert and oriented to person, place, and time.  Psychiatric:        Mood and Affect: Mood normal.        Behavior: Behavior normal.   Data Reviewed:  Results are pending, will review when available.  Assessment and Plan: Principal Problem:   Acute lower GI bleeding Observation/telemetry. Clear liquid diet. Monitor hematocrit and hemoglobin. Transfuse as needed. Pantoprazole 40 mg IVP daily. GI consult appreciated.  Active Problems:   Hypertension Continue amlodipine 10 mg p.o. daily. Hydralazine 10 mg every 4 hours as needed. Monitor blood pressure and heart rate.    Class 1 obesity Current BMI 34.75 kg/m. Would benefit from lifestyle modifications. Follow-up with PCP.    Advance Care Planning:   Code Status: Full Code   Consults: [facility] Willis Modena MD).  Family Communication:   Severity of Illness: The appropriate patient status for this patient is OBSERVATION. Observation status is judged to be reasonable and necessary in order to provide the required intensity of service to ensure the patient's safety. The patient's presenting symptoms, physical exam findings, and initial radiographic and laboratory data in the context of their medical condition is felt to place them at decreased risk for further clinical deterioration. Furthermore, it is anticipated that the patient will be medically stable for discharge from the hospital within 2 midnights of  admission.   Author: Bobette Mo, MD 06/01/2022 8:42 AM  For on call review [email or web address].   This document was prepared using Dragon voice recognition software and may contain some unintended transcription errors.

## 2022-06-01 NOTE — ED Notes (Signed)
Nausea subsided, warm blankets given, pain controlled at this time.

## 2022-06-01 NOTE — ED Notes (Signed)
Patient has had 3 grossly positive with clots liquid dark and some BRBPR

## 2022-06-01 NOTE — TOC CM/SW Note (Signed)
Transition of Care [facility]) Screening Note  Patient Details  Name: Jessica Yu Date of Birth: 1975/09/26  Transition of Care [facility]) CM/SW Contact:    Ewing Schlein, LCSW Phone Number: 06/01/2022, 1:24 PM  Transition of Care Department [facility]) has reviewed patient and no TOC needs have been identified at this time. We will continue to monitor patient advancement through interdisciplinary progression rounds. If new patient transition needs arise, please place a TOC consult.

## 2022-06-01 NOTE — ED Notes (Signed)
Patient to bathroom BRBPR noted and some dark maroon clots also

## 2022-06-01 NOTE — ED Triage Notes (Signed)
Pt reports rectal bleeding 1.5 hours ago x 4 episodes. Stomach aching since yesterday. Pt reports she had a GI bleed about 3 years ago. Hypertensive in triage, states she is taking her bp medication.

## 2022-06-01 NOTE — Consult Note (Signed)
[facility] Consultation Note  Referring Provider: [facility] Primary Care Physician:  Laurena Bering, DO Primary Gastroenterologist:  Dr. Kathi Der  Reason for Consultation:  Hematochezia  HPI: Jessica Yu is a 47 y.o. female admitted for hematochezia.  Patient had similar presentation about 3 years ago.  She ended up having endoscopy and colonoscopy by Dr. Levora Angel in September 2021 showing (Endoscopy:  mild esophagitis, otherwise unrevealing; colonoscopy:  fair prep, extensive universal diverticulosis, hyperplastic polyps, repeat exam 3 years = September 2024 advised).  Patient had abdominal cramps, foul-smelling flatus yesterday followed by several episodes of hematochezia.  Bleeding has slowed down, only one episode today.  Occasional NSAIDs.  No troubles with constipation.   Past Medical History:  Diagnosis Date   Hypertension     Past Surgical History:  Procedure Laterality Date   EYE SURGERY     HYSTERECTOMY ABDOMINAL WITH SALPINGECTOMY     KNEE ARTHROSCOPY Right 01/2020    Prior to Admission medications   Medication Sig Start Date End Date Taking? Authorizing Provider  amLODipine (NORVASC) 10 MG tablet Take 10 mg by mouth daily. 03/30/19  Yes [provider]  fluticasone (FLOVENT HFA) 110 MCG/ACT inhaler Inhale 1 puff into the lungs 4 (four) times daily as needed (as needed). 05/01/21  Yes [provider]  hydrochlorothiazide (HYDRODIURIL) 25 MG tablet Take 25 mg by mouth daily.   Yes [provider]    Current Facility-Administered Medications  Medication Dose Route Frequency Provider Last Rate Last Admin   0.9 % NaCl with KCl 20 mEq/ L  infusion   Intravenous Continuous Bobette Mo, MD 100 mL/hr at 06/01/22 1013 New Bag at 06/01/22 1013   acetaminophen (TYLENOL) tablet 650 mg  650 mg Oral Q6H PRN Bobette Mo, MD       Or   acetaminophen (TYLENOL) suppository 650 mg  650 mg Rectal Q6H PRN Bobette Mo, MD       famotidine (PEPCID) IVPB 20 mg premix  20 mg Intravenous Q12H Bobette Mo, MD 100 mL/hr at 06/01/22 1014 20 mg at 06/01/22 1014   ondansetron (ZOFRAN) tablet 4 mg  4 mg Oral Q6H PRN Bobette Mo, MD       Or   ondansetron [facility]) injection 4 mg  4 mg Intravenous Q6H PRN Bobette Mo, MD        Allergies as of 06/01/2022 - Review Complete 06/01/2022  Allergen Reaction Noted   Neomycin Hives and Swelling 10/07/2013   Phenytoin sodium extended Other (See Comments) and Nausea And Vomiting 09/12/2018   Erythromycin base  02/02/2020   Hydromorphone Nausea And Vomiting 11/02/2013   Lisinopril Itching 06/12/2021   Losartan Swelling 06/18/2021   Pantoprazole Other (See Comments) 02/06/2021    Family History  Problem Relation Age of Onset   Hypothyroidism Mother    Heart disease Father    Crohn's disease Maternal Grandmother    Pancreatic cancer Maternal Grandmother    Bladder Cancer Maternal Grandfather     Social History   Socioeconomic History   Marital status: Single    Spouse name: Not on file   Number of children: Not on file   Years of education: Not on file   Highest education level: Not on file  Occupational History   Not on file  Tobacco Use   Smoking status: Never   Smokeless tobacco: Never  Vaping Use   Vaping Use: Never used  Substance and Sexual Activity   Alcohol use:  Not Currently   Drug use: Not Currently   Sexual activity: Yes  Other Topics Concern   Not on file  Social History Narrative   Not on file   Social Determinants of Health   Financial Resource Strain: Not on file  Food Insecurity: Not on file  Transportation Needs: Not on file  Physical Activity: Not on file  Stress: Not on file  Social Connections: Not on file  Intimate Partner Violence: Not on file    Review of Systems: As per HPI, all others negative  Physical Exam: Vital signs in last 24 hours: Temp:  [98 F (36.7 C)-98.4 F (36.9  C)] 98.4 F (36.9 C) (05/18 0913) Pulse Rate:  [71-98] 81 (05/18 0913) Resp:  [10-20] 18 (05/18 0913) BP: (152-194)/(97-117) 162/97 (05/18 0913) SpO2:  [96 %-100 %] 99 % (05/18 0913) Weight:  [86.2 kg] 86.2 kg (05/18 0913) Last BM Date : 06/01/22 General:   Alert,  Well-developed, well-nourished, pleasant and cooperative in NAD Head:  Normocephalic and atraumatic. Eyes:  Sclera clear, no icterus.   Conjunctiva pink. Ears:  Normal auditory acuity. Nose:  No deformity, discharge,  or lesions. Mouth:  No deformity or lesions.  Oropharynx pink & moist. Neck:  Supple; no masses or thyromegaly. Lungs:  Clear throughout to auscultation.   No wheezes, crackles, or rhonchi. No acute distress. Heart:  Regular rate and rhythm; no murmurs, clicks, rubs,  or gallops. Abdomen:  Soft, nontender and nondistended. No masses, hepatosplenomegaly or hernias noted. Normal bowel sounds, without guarding, and without rebound.     Msk:  Symmetrical without gross deformities. Normal posture. Pulses:  Normal pulses noted. Extremities:  Without clubbing or edema. Neurologic:  Alert and  oriented x4;  grossly normal neurologically. Skin:  Intact without significant lesions or rashes. Cervical Nodes:  No significant cervical adenopathy. Psych:  Alert and cooperative. Normal mood and affect.   Lab Results: Recent Labs    06/01/22 0119 06/01/22 0939  WBC 8.3  --   HGB 12.9 11.3*  HCT 40.0 34.9*  PLT 309  --    BMET Recent Labs    06/01/22 0119  NA 140  K 3.6  CL 101  CO2 29  GLUCOSE 90  BUN 17  CREATININE 1.05*  CALCIUM 9.4   LFT Recent Labs    06/01/22 0119  PROT 7.2  ALBUMIN 4.4  AST 20  ALT 19  ALKPHOS 53  BILITOT 0.3   PT/INR No results for input(s): "LABPROT", "INR" in the last 72 hours.  Studies/Results: No results found.  Impression:   Recurrent hematochezia.  Suspect from colonic diverticulosis. Acute blood loss anemia, mild.  Plan:  Discussed avoid constipation  and avoiding NSAIDs as methods to decrease risk of recurrent presumed colonic diverticular bleeding. Soft diet. Follow CBCs. If no suspected ongoing active bleeding (I will expect continued passage of some old blood) over the next 24 hours, could consider discharge home tomorrow. Patient will need outpatient colonoscopy as already advised in September. [facility] will follow.   LOS: 0 days   Jahzion Brogden M  06/01/2022, 12:42 PM  Cell [phone] If no answer or after 5 PM call [phone]

## 2022-06-02 ENCOUNTER — Encounter (HOSPITAL_COMMUNITY): Payer: Self-pay | Admitting: Internal Medicine

## 2022-06-02 DIAGNOSIS — K922 Gastrointestinal hemorrhage, unspecified: Secondary | ICD-10-CM

## 2022-06-02 LAB — CBC
HCT: 33.2 % — ABNORMAL LOW (ref 36.0–46.0)
Hemoglobin: 10.7 g/dL — ABNORMAL LOW (ref 12.0–15.0)
MCH: 28.3 pg (ref 26.0–34.0)
MCHC: 32.2 g/dL (ref 30.0–36.0)
MCV: 87.8 fL (ref 80.0–100.0)
Platelets: 265 10*3/uL (ref 150–400)
RBC: 3.78 MIL/uL — ABNORMAL LOW (ref 3.87–5.11)
RDW: 14.4 % (ref 11.5–15.5)
WBC: 7 10*3/uL (ref 4.0–10.5)
nRBC: 0 % (ref 0.0–0.2)

## 2022-06-02 LAB — HIV ANTIBODY (ROUTINE TESTING W REFLEX): HIV Screen 4th Generation wRfx: NONREACTIVE

## 2022-06-02 LAB — COMPREHENSIVE METABOLIC PANEL
ALT: 19 U/L (ref 0–44)
AST: 17 U/L (ref 15–41)
Albumin: 3.4 g/dL — ABNORMAL LOW (ref 3.5–5.0)
Alkaline Phosphatase: 44 U/L (ref 38–126)
Anion gap: 8 (ref 5–15)
BUN: 14 mg/dL (ref 6–20)
CO2: 25 mmol/L (ref 22–32)
Calcium: 8.2 mg/dL — ABNORMAL LOW (ref 8.9–10.3)
Chloride: 105 mmol/L (ref 98–111)
Creatinine, Ser: 0.87 mg/dL (ref 0.44–1.00)
GFR, Estimated: >60 mL/min (ref >60)
Glucose, Bld: 96 mg/dL (ref 70–99)
Potassium: 3.6 mmol/L (ref 3.5–5.1)
Sodium: 138 mmol/L (ref 135–145)
Total Bilirubin: 0.5 mg/dL (ref 0.3–1.2)
Total Protein: 6.2 g/dL — ABNORMAL LOW (ref 6.5–8.1)

## 2022-06-02 LAB — ABO/RH: ABO/RH(D): O POS

## 2022-06-02 MED ORDER — FAMOTIDINE 20 MG PO TABS
20.0000 mg | ORAL_TABLET | Freq: Two times a day (BID) | ORAL | Status: DC
  Administered 2022-06-02 – 2022-06-03 (×2): 20 mg via ORAL
  Filled 2022-06-02 (×2): qty 1

## 2022-06-02 NOTE — Hospital Course (Signed)
47 y.o. female with medical history significant of hypertension, class I obesity, right knee meniscal tear, history of GI bleed about 3 years ago who is coming to the emergency department due to multiple episodes of rectal bleeding that were preceded by LLQ pain and foul-smelling flatulence

## 2022-06-02 NOTE — Progress Notes (Signed)
  Progress Note   Patient: Jessica Yu NFA:[id number] DOB: [birth year] DOA: 06/01/2022     0 DOS: the patient was seen and examined on 06/02/2022   Brief hospital course: 47 y.o. female with medical history significant of hypertension, class I obesity, right knee meniscal tear, history of GI bleed about 3 years ago who is coming to the emergency department due to multiple episodes of rectal bleeding that were preceded by LLQ pain and foul-smelling flatulence   Assessment and Plan: Principal Problem:   Acute lower GI bleeding -Hgb has remained stable thus far -reports passage of clots this AM with some lower abd cramping -GI is following. Plan to cont to monitor overnight. If rampant bleeding, will need CT angio abd/pelvis -Continued on Pantoprazole 40 mg IVP daily.   Active Problems:   Hypertension Continue amlodipine 10 mg p.o. daily. Hydralazine 10 mg every 4 hours as needed. BP stable     Class 1 obesity Current BMI 34.75 kg/m. Would benefit from lifestyle modifications.      Subjective: Complaining of mild lower quadrant cramping and passage of clots this AM  Physical Exam: Vitals:   06/01/22 2035 06/02/22 0450 06/02/22 0940 06/02/22 1322  BP: (!) 138/100 (!) 160/87 (!) 141/89 131/84  Pulse: 77 70 83 86  Resp: 20   18  Temp: 98.3 F (36.8 C) 97.8 F (36.6 C)  98 F (36.7 C)  TempSrc: Oral Oral  Oral  SpO2: 100% 98% 100% 100%  Weight:      Height:       General exam: Awake, laying in bed, in nad Respiratory system: Normal respiratory effort, no wheezing Cardiovascular system: regular rate, s1, s2 Gastrointestinal system: Soft, nondistended, positive BS Central nervous system: CN2-12 grossly intact, strength intact Extremities: Perfused, no clubbing Skin: Normal skin turgor, no notable skin lesions seen Psychiatry: Mood normal // no visual hallucinations   Data Reviewed:  Labs reviewed: Na 138, K 3.6, Cr 0.87, Hgb 10.7  Family Communication: Pt in room,  family not at bedside  Disposition: Status is: Observation The patient remains OBS appropriate and will d/c before 2 midnights.  Planned Discharge Destination: Home     Author: Rickey Barbara, MD 06/02/2022 3:36 PM  For on call review [email or web address].

## 2022-06-02 NOTE — Progress Notes (Signed)
Subjective: Some abdominal cramps. Passed some blood clots this morning.  Objective: Vital signs in last 24 hours: Temp:  [97.8 F (36.6 C)-98.3 F (36.8 C)] 97.8 F (36.6 C) (05/19 0450) Pulse Rate:  [70-85] 83 (05/19 0940) Resp:  [17-20] 20 (05/18 2035) BP: (138-174)/(87-111) 141/89 (05/19 0940) SpO2:  [98 %-100 %] 100 % (05/19 0940) Weight change: 0 kg Last BM Date : 06/01/22  PE: GEN:  Overweight, NAD  Lab Results: CBC    Component Value Date/Time   WBC 7.0 06/02/2022 0419   RBC 3.78 (L) 06/02/2022 0419   HGB 10.7 (L) 06/02/2022 0419   HCT 33.2 (L) 06/02/2022 0419   PLT 265 06/02/2022 0419   MCV 87.8 06/02/2022 0419   MCH 28.3 06/02/2022 0419   MCHC 32.2 06/02/2022 0419   RDW 14.4 06/02/2022 0419   LYMPHSABS 2.8 08/29/2019 0416   MONOABS 0.6 08/29/2019 0416   EOSABS 0.3 08/29/2019 0416   BASOSABS 0.0 08/29/2019 0416   Assessment:   Recurrent hematochezia.  Suspect from colonic diverticulosis. Acute blood loss anemia, mild.  Plan:   I looked at her bowel movement, which appears to be mostly old blood with clots but maybe some fresh blood as well. Would watch patient another 24 hours.  If rampant bleeding, will need CT angiogram abdomen/pelvis.  If no recurrent bleeding, maybe discharge home tomorrow. Follow CBC. Case reviewed with Dr. Rhona Leavens.    Freddy Jaksch 06/02/2022, 10:13 AM   Cell [phone] If no answer or after 5 PM call [phone]

## 2022-06-03 DIAGNOSIS — K922 Gastrointestinal hemorrhage, unspecified: Secondary | ICD-10-CM | POA: Diagnosis not present

## 2022-06-03 LAB — COMPREHENSIVE METABOLIC PANEL
ALT: 18 U/L (ref 0–44)
AST: 16 U/L (ref 15–41)
Albumin: 3.5 g/dL (ref 3.5–5.0)
Alkaline Phosphatase: 45 U/L (ref 38–126)
Anion gap: 6 (ref 5–15)
BUN: 16 mg/dL (ref 6–20)
CO2: 23 mmol/L (ref 22–32)
Calcium: 8.3 mg/dL — ABNORMAL LOW (ref 8.9–10.3)
Chloride: 107 mmol/L (ref 98–111)
Creatinine, Ser: 0.94 mg/dL (ref 0.44–1.00)
GFR, Estimated: >60 mL/min (ref >60)
Glucose, Bld: 129 mg/dL — ABNORMAL HIGH (ref 70–99)
Potassium: 3.7 mmol/L (ref 3.5–5.1)
Sodium: 136 mmol/L (ref 135–145)
Total Bilirubin: 0.3 mg/dL (ref 0.3–1.2)
Total Protein: 6.6 g/dL (ref 6.5–8.1)

## 2022-06-03 LAB — CBC
HCT: 33.5 % — ABNORMAL LOW (ref 36.0–46.0)
Hemoglobin: 10.8 g/dL — ABNORMAL LOW (ref 12.0–15.0)
MCH: 28.3 pg (ref 26.0–34.0)
MCHC: 32.2 g/dL (ref 30.0–36.0)
MCV: 87.9 fL (ref 80.0–100.0)
Platelets: 298 10*3/uL (ref 150–400)
RBC: 3.81 MIL/uL — ABNORMAL LOW (ref 3.87–5.11)
RDW: 14.4 % (ref 11.5–15.5)
WBC: 8.5 10*3/uL (ref 4.0–10.5)
nRBC: 0 % (ref 0.0–0.2)

## 2022-06-03 NOTE — Progress Notes (Signed)
[facility] Progress Note  Jessica Yu 47 y.o. 1975-04-12   Subjective: Reports one small episode of blood clots this morning. Denies abdominal pain. Tolerating soft diet.  Objective: Vital signs: Vitals:   06/02/22 1951 06/03/22 0506  BP: (!) 146/81 (!) 160/94  Pulse: 89 87  Resp: 18   Temp: 98.8 F (37.1 C) 97.7 F (36.5 C)  SpO2: 100% 100%    Physical Exam: Gen: alert, no acute distress, obese, pleasant HEENT: anicteric sclera CV: RRR Chest: CTA B Abd: diffuse tenderness with minimal guarding, soft, nondistended, +BS Ext: no edema  Lab Results: Recent Labs    06/02/22 0419 06/03/22 0457  NA 138 136  K 3.6 3.7  CL 105 107  CO2 25 23  GLUCOSE 96 129*  BUN 14 16  CREATININE 0.87 0.94  CALCIUM 8.2* 8.3*   Recent Labs    06/02/22 0419 06/03/22 0457  AST 17 16  ALT 19 18  ALKPHOS 44 45  BILITOT 0.5 0.3  PROT 6.2* 6.6  ALBUMIN 3.4* 3.5   Recent Labs    06/02/22 0419 06/03/22 0457  WBC 7.0 8.5  HGB 10.7* 10.8*  HCT 33.2* 33.5*  MCV 87.8 87.9  PLT 265 298      Assessment/Plan: Presumed diverticular bleed that is resolving. Hgb stable. Ok to d/c today from GI standpoint. F/U with Dr. Levora Angel in June. Will sign off. Call if questions.   Jessica Yu 06/03/2022, 8:58 AM  Questions please call [phone] ID: Jessica Yu, female   DOB: [birth year], 47 y.o.   MRN: [id number]

## 2022-06-03 NOTE — Discharge Summary (Signed)
Physician Discharge Summary   Patient: Jessica Yu MRN: [id number] DOB: April 24, 1975  Admit date:     06/01/2022  Discharge date: 06/03/22  Discharge Physician: Rickey Barbara   PCP: Laurena Bering, DO   Recommendations at discharge:    Follow up with PCP in 2-3 weeks Recommend rechecking CBC in 1-2 weeks  Discharge Diagnoses: Principal Problem:   Acute lower GI bleeding Active Problems:   Hypertension   Class 1 obesity  Resolved Problems:   * No resolved hospital problems. *  Hospital Course: 47 y.o. female with medical history significant of hypertension, class I obesity, right knee meniscal tear, history of GI bleed about 3 years ago who is coming to the emergency department due to multiple episodes of rectal bleeding that were preceded by LLQ pain and foul-smelling flatulence   Assessment and Plan: Principal Problem:   Acute lower GI bleeding -Hgb has remained stable thus far -reported some passage of clots with some lower abd cramping -GI is following. Pt was monitored overnight -Hgb was found to be stable and clear for d/c today -Pt was given Pantoprazole 40 mg IVP this visit   Active Problems:   Hypertension Patient was continued on amlodipine 10 mg p.o. daily. BP remained stable     Class 1 obesity Current BMI 34.75 kg/m. Would benefit from lifestyle modifications.    Consultants: GI Procedures performed:   Disposition: Home Diet recommendation:  Regular diet DISCHARGE MEDICATION: Allergies as of 06/03/2022       Reactions   Neomycin Hives, Swelling   Other reaction(s): GI Intolerance, GI Upset (intolerance) Other reaction(s): GI Upset (intolerance) Other reaction(s): GI Upset (intolerance) Other reaction(s): GI Upset (intolerance) Other reaction(s): GI Upset (intolerance)   Phenytoin Sodium Extended Other (See Comments), Nausea And Vomiting   Pt. States she has never taken   Erythromycin Base    Hydromorphone Nausea And Vomiting   Lisinopril  Itching   Tongue and mouth blisters   Losartan Swelling   Pantoprazole Other (See Comments)   Throat swelling.  Throat swelling.        Medication List     TAKE these medications    amLODipine 10 MG tablet Commonly known as: NORVASC Take 10 mg by mouth daily.   fluticasone 110 MCG/ACT inhaler Commonly known as: FLOVENT HFA Inhale 1 puff into the lungs 4 (four) times daily as needed (as needed).   hydrochlorothiazide 25 MG tablet Commonly known as: HYDRODIURIL Take 25 mg by mouth daily.        Follow-up Information     Tonny Bollman L, DO Follow up in 2 week(s).   Specialty: Family Medicine Why: Hospital follow up Contact information: [address] [place] [address] [phone]                Discharge Exam: Ceasar Mons Weights   06/01/22 0109 06/01/22 0913  Weight: 86.2 kg 86.2 kg   General exam: Awake, laying in bed, in nad Respiratory system: Normal respiratory effort, no wheezing Cardiovascular system: regular rate, s1, s2 Gastrointestinal system: Soft, nondistended, positive BS Central nervous system: CN2-12 grossly intact, strength intact Extremities: Perfused, no clubbing Skin: Normal skin turgor, no notable skin lesions seen Psychiatry: Mood normal // no visual hallucinations   Condition at discharge: fair  The results of significant diagnostics from this hospitalization (including imaging, microbiology, ancillary and laboratory) are listed below for reference.   Imaging Studies: No results found.  Microbiology: Results for orders placed or performed during the hospital encounter  of 08/26/19  SARS Coronavirus 2 by RT PCR (hospital order, performed in [facility] hospital lab) Nasopharyngeal Nasopharyngeal Swab     Status: None   Collection Time: 08/27/19  2:52 AM   Specimen: Nasopharyngeal Swab  Result Value Ref Range Status   SARS Coronavirus 2 NEGATIVE NEGATIVE Final    Comment: (NOTE) SARS-CoV-2 target nucleic acids are NOT  DETECTED.  The SARS-CoV-2 RNA is generally detectable in upper and lower respiratory specimens during the acute phase of infection. The lowest concentration of SARS-CoV-2 viral copies this assay can detect is 250 copies / mL. A negative result does not preclude SARS-CoV-2 infection and should not be used as the sole basis for treatment or other patient management decisions.  A negative result may occur with improper specimen collection / handling, submission of specimen other than nasopharyngeal swab, presence of viral mutation(s) within the areas targeted by this assay, and inadequate number of viral copies (<250 copies / mL). A negative result must be combined with clinical observations, patient history, and epidemiological information.  Fact Sheet for Patients:   BoilerBrush.com.cy  Fact Sheet for Healthcare Providers: https://pope.com/  This test is not yet approved or  cleared by the [place] FDA and has been authorized for detection and/or diagnosis of SARS-CoV-2 by FDA under an Emergency Use Authorization (EUA).  This EUA will remain in effect (meaning this test can be used) for the duration of the COVID-19 declaration under Section 564(b)(1) of the Act, 21 U.S.C. section 360bbb-3(b)(1), unless the authorization is terminated or revoked sooner.  Performed at [facility], [address] [place], [place] [address]     Labs: CBC: Recent Labs  Lab 06/01/22 0119 06/01/22 6045 06/01/22 1604 06/01/22 2026 06/02/22 0419 06/03/22 0457  WBC 8.3  --   --   --  7.0 8.5  HGB 12.9 11.3* 11.7* 11.8* 10.7* 10.8*  HCT 40.0 34.9* 37.5 36.0 33.2* 33.5*  MCV 86.2  --   --   --  87.8 87.9  PLT 309  --   --   --  265 298   Basic Metabolic Panel: Recent Labs  Lab 06/01/22 0119 06/02/22 0419 06/03/22 0457  NA 140 138 136  K 3.6 3.6 3.7  CL 101 105 107  CO2 29 25 23   GLUCOSE 90 96 129*  BUN 17 14 16    CREATININE 1.05* 0.87 0.94  CALCIUM 9.4 8.2* 8.3*   Liver Function Tests: Recent Labs  Lab 06/01/22 0119 06/02/22 0419 06/03/22 0457  AST 20 17 16   ALT 19 19 18   ALKPHOS 53 44 45  BILITOT 0.3 0.5 0.3  PROT 7.2 6.2* 6.6  ALBUMIN 4.4 3.4* 3.5   CBG: No results for input(s): "GLUCAP" in the last 168 hours.  Discharge time spent: less than 30 minutes.  Signed: Rickey Barbara, MD [facility] 06/03/2022

## 2023-01-13 ENCOUNTER — Emergency Department (HOSPITAL_COMMUNITY)
Admission: EM | Admit: 2023-01-13 | Discharge: 2023-01-13 | Disposition: A | Discharge status: Home / Self Care | Payer: Managed Care, Other (non HMO)

## 2023-01-13 ENCOUNTER — Other Ambulatory Visit: Payer: Self-pay

## 2023-01-13 ENCOUNTER — Encounter (HOSPITAL_COMMUNITY): Payer: Self-pay

## 2023-01-13 DIAGNOSIS — R1084 Generalized abdominal pain: Secondary | ICD-10-CM | POA: Diagnosis present

## 2023-01-13 DIAGNOSIS — R197 Diarrhea, unspecified: Secondary | ICD-10-CM | POA: Insufficient documentation

## 2023-01-13 DIAGNOSIS — R109 Unspecified abdominal pain: Secondary | ICD-10-CM

## 2023-01-13 LAB — CBC
HCT: 34.5 % — ABNORMAL LOW (ref 36.0–46.0)
HCT: 34.8 % — ABNORMAL LOW (ref 36.0–46.0)
Hemoglobin: 11.1 g/dL — ABNORMAL LOW (ref 12.0–15.0)
Hemoglobin: 11.3 g/dL — ABNORMAL LOW (ref 12.0–15.0)
MCH: 28 pg (ref 26.0–34.0)
MCH: 27.9 pg (ref 26.0–34.0)
MCHC: 32.2 g/dL (ref 30.0–36.0)
MCHC: 32.5 g/dL (ref 30.0–36.0)
MCV: 86.9 fL (ref 80.0–100.0)
MCV: 85.9 fL (ref 80.0–100.0)
Platelets: 274 10*3/uL (ref 150–400)
Platelets: 289 10*3/uL (ref 150–400)
RBC: 3.97 MIL/uL (ref 3.87–5.11)
RBC: 4.05 MIL/uL (ref 3.87–5.11)
RDW: 14.8 % (ref 11.5–15.5)
RDW: 15 % (ref 11.5–15.5)
WBC: 7.7 10*3/uL (ref 4.0–10.5)
WBC: 8.9 10*3/uL (ref 4.0–10.5)
nRBC: 0 % (ref 0.0–0.2)
nRBC: 0 % (ref 0.0–0.2)

## 2023-01-13 LAB — COMPREHENSIVE METABOLIC PANEL
ALT: 18 U/L (ref 0–44)
AST: 22 U/L (ref 15–41)
Albumin: 3.3 g/dL — ABNORMAL LOW (ref 3.5–5.0)
Alkaline Phosphatase: 44 U/L (ref 38–126)
Anion gap: 6 (ref 5–15)
BUN: 15 mg/dL (ref 6–20)
CO2: 24 mmol/L (ref 22–32)
Calcium: 8.6 mg/dL — ABNORMAL LOW (ref 8.9–10.3)
Chloride: 106 mmol/L (ref 98–111)
Creatinine, Ser: 1.09 mg/dL — ABNORMAL HIGH (ref 0.44–1.00)
GFR, Estimated: >60 mL/min (ref >60)
Glucose, Bld: 108 mg/dL — ABNORMAL HIGH (ref 70–99)
Potassium: 3.7 mmol/L (ref 3.5–5.1)
Sodium: 136 mmol/L (ref 135–145)
Total Bilirubin: 0.5 mg/dL (ref 0.0–1.2)
Total Protein: 6 g/dL — ABNORMAL LOW (ref 6.5–8.1)

## 2023-01-13 LAB — LIPASE, BLOOD: Lipase: 45 U/L (ref 11–51)

## 2023-01-13 MED ORDER — ONDANSETRON 4 MG PO TBDP
4.0000 mg | ORAL_TABLET | Freq: Once | ORAL | Status: AC
  Administered 2023-01-13: 4 mg via ORAL
  Filled 2023-01-13: qty 1

## 2023-01-13 NOTE — ED Provider Notes (Signed)
[place] EMERGENCY DEPARTMENT AT [facility] Provider Note   CSN: [id number] Arrival date & time: 01/13/23  1017     History  Chief Complaint  Patient presents with   Abdominal Pain   Rectal Bleeding   Nausea    Jessica Yu is a 47 y.o. female.  This is a 47 year old female presenting emergency department with generalized crampy abdominal pain nausea and several episodes of bloody diarrhea this morning.  No fevers, but reported chills.  No known sick contacts.  No dysuria.   Abdominal Pain Associated symptoms: hematochezia   Rectal Bleeding Associated symptoms: abdominal pain        Home Medications Prior to Admission medications   Medication Sig Start Date End Date Taking? Authorizing Provider  amLODipine (NORVASC) 10 MG tablet Take 10 mg by mouth daily. 03/30/19   [provider]  fluticasone (FLOVENT HFA) 110 MCG/ACT inhaler Inhale 1 puff into the lungs 4 (four) times daily as needed (as needed). 05/01/21   [provider]  hydrochlorothiazide (HYDRODIURIL) 25 MG tablet Take 25 mg by mouth daily.    [provider]      Allergies    Neomycin, Phenytoin sodium extended, Erythromycin base, Hydromorphone, Lisinopril, Losartan, and Pantoprazole    Review of Systems   Review of Systems  Gastrointestinal:  Positive for abdominal pain and hematochezia.    Physical Exam Updated Vital Signs BP (!) 141/100   Pulse 80   Temp 98 F (36.7 C)   Resp 18   Ht 5' 2.5" (1.588 m)   Wt 88 kg   SpO2 100%   BMI 34.92 kg/m  Physical Exam Vitals and nursing note reviewed.  Constitutional:      General: She is not in acute distress.    Appearance: She is not toxic-appearing.  HENT:     Head: Normocephalic.  Eyes:     Extraocular Movements: Extraocular movements intact.  Cardiovascular:     Rate and Rhythm: Normal rate and regular rhythm.  Pulmonary:     Effort: Pulmonary effort is normal.     Breath sounds: Normal breath  sounds.  Abdominal:     Tenderness: There is generalized abdominal tenderness (mild). There is no guarding or rebound.  Skin:    General: Skin is warm and dry.     Capillary Refill: Capillary refill takes less than 2 seconds.  Neurological:     Mental Status: She is alert.     ED Results / Procedures / Treatments   Labs (all labs ordered are listed, but only abnormal results are displayed) Labs Reviewed  COMPREHENSIVE METABOLIC PANEL - Abnormal; Notable for the following components:      Result Value   Glucose, Bld 108 (*)    Creatinine, Ser 1.09 (*)    Calcium 8.6 (*)    Total Protein 6.0 (*)    Albumin 3.3 (*)    All other components within normal limits  CBC - Abnormal; Notable for the following components:   Hemoglobin 11.1 (*)    HCT 34.5 (*)    All other components within normal limits  GASTROINTESTINAL PANEL BY PCR, STOOL (REPLACES STOOL CULTURE)  LIPASE, BLOOD  CBC    EKG None  Radiology No results found.  Procedures Procedures    Medications Ordered in ED Medications  ondansetron (ZOFRAN-ODT) disintegrating tablet 4 mg (has no administration in time range)    ED Course/ Medical Decision Making/ A&P  Medical Decision Making Is a well-appearing 47 year old female presenting emergency department with nausea, generalized abdominal pain and bloody diarrhea.  She is afebrile nontachycardic hemodynamically stable.  Benign abdominal exam.  Reports history of diverticulitis.  Per chart review she was admitted 05/22/22 for lower GI bleed thought to be secondary to diverticulosis rather than diverticulitis.  Her hemoglobin is stable initially.  No significant metabolic derangements.  BUN not significantly elevated.  Which would point away from upper GI bleed.  Her history seemingly more consistent with gastroenteritis type picture. Repeat CBC and stool cultures ordered.  Amount and/or Complexity of Data Reviewed Labs:  ordered.  Risk Prescription drug management.           Final Clinical Impression(s) / ED Diagnoses Final diagnoses:  None    Rx / DC Orders ED Discharge Orders     None         Coral Spikes, DO 01/13/23 1509

## 2023-01-13 NOTE — ED Provider Notes (Signed)
Care of patient received from prior provider at 3:45 PM, please see their note for complete H/P and care plan.  Received handoff per ED course.  Clinical Course as of 01/13/23 1545  Mon Jan 13, 2023  1543 Stable HO from [facility] N/Diarrhea. HX of Diverticulosis with bleed.  Trending HGB if normal ok to discharged. [CC]    Clinical Course User Index [CC] Glyn Ade, MD    Reassessment: Reassessed.  8 hours of observation, no further bright red blood per rectum.  Hemoglobin stable.  Patient feels comfortable following up in the outpatient setting with her GI.  Discussed supportive care in the interim.  Disposition:  I have considered need for hospitalization, however, considering all of the above, I believe this patient is stable for discharge at this time.  Patient/family educated about specific return precautions for given chief complaint and symptoms.  Patient/family educated about follow-up with PCP .     Patient/family expressed understanding of return precautions and need for follow-up. Patient spoken to regarding all imaging and laboratory results and appropriate follow up for these results. All education provided in verbal form with additional information in written form. Time was allowed for answering of patient questions. Patient discharged.    Emergency Department Medication Summary:   Medications  ondansetron (ZOFRAN-ODT) disintegrating tablet 4 mg (4 mg Oral Given 01/13/23 1608)            Glyn Ade, MD 01/13/23 1845

## 2023-01-13 NOTE — ED Triage Notes (Signed)
Pt c.o abd pain, nausea and rectal bleeding since this morning. Hx of diverticulitis in May of this year and states this feels similar.

## 2023-05-17 ENCOUNTER — Emergency Department (HOSPITAL_COMMUNITY)
Admission: EM | Admit: 2023-05-17 | Discharge: 2023-05-17 | Disposition: A | Discharge status: Home / Self Care | Payer: Worker's Compensation | Attending: Emergency Medicine | Admitting: Emergency Medicine

## 2023-05-17 ENCOUNTER — Emergency Department (HOSPITAL_COMMUNITY)

## 2023-05-17 ENCOUNTER — Encounter (HOSPITAL_COMMUNITY): Payer: Self-pay

## 2023-05-17 ENCOUNTER — Other Ambulatory Visit: Payer: Self-pay

## 2023-05-17 DIAGNOSIS — S060X0A Concussion without loss of consciousness, initial encounter: Secondary | ICD-10-CM | POA: Insufficient documentation

## 2023-05-17 DIAGNOSIS — Z79899 Other long term (current) drug therapy: Secondary | ICD-10-CM | POA: Insufficient documentation

## 2023-05-17 DIAGNOSIS — Y990 Civilian activity done for income or pay: Secondary | ICD-10-CM | POA: Insufficient documentation

## 2023-05-17 DIAGNOSIS — I10 Essential (primary) hypertension: Secondary | ICD-10-CM | POA: Diagnosis not present

## 2023-05-17 DIAGNOSIS — R519 Headache, unspecified: Secondary | ICD-10-CM | POA: Diagnosis present

## 2023-05-17 DIAGNOSIS — X58XXXA Exposure to other specified factors, initial encounter: Secondary | ICD-10-CM | POA: Diagnosis not present

## 2023-05-17 LAB — POCT PREGNANCY, URINE: Preg Test, Ur: NEGATIVE

## 2023-05-17 MED ORDER — KETOROLAC TROMETHAMINE 15 MG/ML IJ SOLN
15.0000 mg | Freq: Once | INTRAMUSCULAR | Status: AC
  Administered 2023-05-17: 15 mg via INTRAVENOUS
  Filled 2023-05-17: qty 1

## 2023-05-17 MED ORDER — DIPHENHYDRAMINE HCL 50 MG/ML IJ SOLN
25.0000 mg | Freq: Once | INTRAMUSCULAR | Status: AC
  Administered 2023-05-17: 25 mg via INTRAVENOUS
  Filled 2023-05-17: qty 1

## 2023-05-17 MED ORDER — HYDRALAZINE HCL 20 MG/ML IJ SOLN
5.0000 mg | Freq: Once | INTRAMUSCULAR | Status: DC
  Filled 2023-05-17: qty 1

## 2023-05-17 MED ORDER — ACETAMINOPHEN 500 MG PO TABS
1000.0000 mg | ORAL_TABLET | Freq: Once | ORAL | Status: AC
  Administered 2023-05-17: 1000 mg via ORAL
  Filled 2023-05-17: qty 2

## 2023-05-17 MED ORDER — AMLODIPINE BESYLATE 5 MG PO TABS
5.0000 mg | ORAL_TABLET | Freq: Once | ORAL | Status: AC
  Administered 2023-05-17: 5 mg via ORAL
  Filled 2023-05-17: qty 1

## 2023-05-17 MED ORDER — HYDROCHLOROTHIAZIDE 25 MG PO TABS
25.0000 mg | ORAL_TABLET | Freq: Every day | ORAL | Status: DC
  Administered 2023-05-17: 25 mg via ORAL
  Filled 2023-05-17: qty 1

## 2023-05-17 MED ORDER — PROCHLORPERAZINE EDISYLATE 10 MG/2ML IJ SOLN
10.0000 mg | Freq: Once | INTRAMUSCULAR | Status: AC
  Administered 2023-05-17: 10 mg via INTRAVENOUS
  Filled 2023-05-17: qty 2

## 2023-05-17 NOTE — ED Provider Notes (Signed)
 [place] EMERGENCY DEPARTMENT AT [facility] Provider Note   CSN: [id number] Arrival date & time: 05/17/23  1555     History {Add pertinent medical, surgical, social history, OB history to HPI:1} Chief Complaint  Patient presents with   Headache    Jessica Yu is a 48 y.o. female with PMH as listed below who presents with HA, head injury.   Pt hit the top of her head at work yesterday while standing up. She did not have LOC or have N/V after the incident. Went about her day and then woke up this AM with a severe headache. Fullness sensation in her head a/w nausea. Having jaw pain, teeth pain, and soreness at the top of her head along with feeling fatigued. Pt reports not remembering if she took her bp medication this morning. Endorses all over fullness in the head sensation, no h/o similar headache. Not a headache person. No visual changes or photophobia. No neck pain. 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    Past Medical History:  Diagnosis Date   Hypertension        Home Medications Prior to Admission medications   Medication Sig Start Date End Date Taking? Authorizing Provider  amLODipine  (NORVASC ) 10 MG tablet Take 10 mg by mouth daily. 03/30/19   [provider]  fluticasone (FLOVENT HFA) 110 MCG/ACT inhaler Inhale 1 puff into the lungs 4 (four) times daily as needed (as needed). 05/01/21   [provider]  hydrochlorothiazide  (HYDRODIURIL ) 25 MG tablet Take 25 mg by mouth daily.    [provider]      Allergies    Neomycin, Phenytoin sodium extended, Erythromycin base, Hydromorphone, Lisinopril, Losartan, and Pantoprazole     Review of Systems   Review of Systems A 10 point review of systems was performed and is negative unless otherwise reported in HPI.  Physical Exam Updated Vital Signs BP (!) 144/118   Pulse 66   Temp 98.4 F (36.9 C) (Oral)   Resp 17   SpO2 99%  Physical Exam General: Normal  appearing female, lying in bed.  HEENT: NCAT, PERRLA, Sclera anicteric, MMM, trachea midline. No facial trauma noted. No C-spine midline TTP stepoffs or deformities. Cardiology: RRR, no murmurs/rubs/gallops.   Resp: Normal respiratory rate and effort. CTAB, no wheezes, rhonchi, crackles.  Abd: Soft, non-tender, non-distended. No rebound tenderness or guarding.  GU: Deferred. MSK: No peripheral edema or signs of trauma. Extremities without deformity or TTP. No cyanosis or clubbing. Skin: warm, dry. Neuro: A&Ox4, R 3rd nerve palsy, otherwise CNs II-XII grossly intact. MAEs. Sensation grossly intact.  Psych: Normal mood and affect.   ED Results / Procedures / Treatments   Labs (all labs ordered are listed, but only abnormal results are displayed) Labs Reviewed  POC URINE PREG, ED    EKG EKG Interpretation Date/Time:  Saturday May 17 2023 18:26:14 EDT Ventricular Rate:  69 PR Interval:  121 QRS Duration:  85 QT Interval:  396 QTC Calculation: 425 R Axis:   45  Text Interpretation: Sinus arrhythmia Right atrial enlargement Confirmed by Annita Kindle [phone]) on 05/17/2023 7:09:21 PM  Radiology CT Head Wo Contrast Result Date: 05/17/2023 CLINICAL DATA:  Headache, increasing frequency or severity. EXAM: CT HEAD WITHOUT CONTRAST TECHNIQUE: Contiguous axial images were obtained from the base of the skull through the vertex without intravenous contrast. RADIATION DOSE REDUCTION: This exam was performed according to the departmental dose-optimization program which includes automated exposure control, adjustment of the mA and/or  kV according to patient size and/or use of iterative reconstruction technique. COMPARISON:  None available FINDINGS: Brain: No acute infarct, hemorrhage, or mass lesion is present. No significant white matter lesions are present. Deep brain nuclei are within normal limits. The ventricles are of normal size. No significant extraaxial fluid collection is present. The brainstem  and cerebellum are within normal limits. Midline structures are within normal limits. Vascular: No hyperdense vessel or unexpected calcification. Skull: Calvarium is intact. No focal lytic or blastic lesions are present. No significant extracranial soft tissue lesion is present. Sinuses/Orbits: The paranasal sinuses and mastoid air cells are clear. The globes and orbits are within normal limits. IMPRESSION: Normal CT appearance of the brain. Electronically Signed   By: Audree Leas M.D.   On: 05/17/2023 18:58    Procedures Procedures  {Document cardiac monitor, telemetry assessment procedure when appropriate:1}  Medications Ordered in ED Medications  ketorolac (TORADOL) 15 MG/ML injection 15 mg (has no administration in time range)  acetaminophen  (TYLENOL ) tablet 1,000 mg (1,000 mg Oral Given 05/17/23 1734)  prochlorperazine (COMPAZINE) injection 10 mg (10 mg Intravenous Given 05/17/23 1734)  diphenhydrAMINE  (BENADRYL ) injection 25 mg (25 mg Intravenous Given 05/17/23 1742)    ED Course/ Medical Decision Making/ A&P                          Medical Decision Making Amount and/or Complexity of Data Reviewed Radiology: ordered. Decision-making details documented in ED Course.  Risk OTC drugs. Prescription drug management.    This patient presents to the ED for concern of head trauma yesterday and headache today, this involves an extensive number of treatment options, and is a complaint that carries with it a high risk of complications and morbidity.  I considered the following differential and admission for this acute, potentially life threatening condition. Overall patient is very well-appearing. She presents initially hypertensive into 220s/130s but this improves on its own.   MDM:    Patient is overall very well-appearing, HDS, non-toxic, afebrile.   No ICH found on CTH Unlikely SAH: headache is non thunderclap. Unlikely Subdural/epidural hematoma: no history of trauma, no  anticoagulation. Unlikely Meningitis: afebrile, no meningismus. Unlikely Temporal arteritis: pt < 38 years old.  no tenderness in temporal area Unlikely Acute angle glaucoma: PERRLA, no eye pain. Unlikely Carbon Monoxide Poisoning: no other house members with similar symptoms.  Plan: Will give headache cocktail and reexamine.  Most likely 2/2 concussion, tension headache, migraine, or headache of non-emergent etiology.   Clinical Course as of 05/17/23 1928  Sat May 17, 2023  1909 CT Head Wo Contrast Normal CT appearance of the brain. [HN]    Clinical Course User Index [HN] Merdis Stalling, MD    Labs: I Ordered, and personally interpreted labs.  The pertinent results include:  ***  Imaging Studies ordered: I ordered imaging studies including CTH I independently visualized and interpreted imaging. I agree with the radiologist interpretation  Additional history obtained from chart review.   Cardiac Monitoring: The patient was maintained on a cardiac monitor.  I personally viewed and interpreted the cardiac monitored which showed an underlying rhythm of: NSR  Reevaluation: After the interventions noted above, I reevaluated the patient and found that they have :improved  Social Determinants of Health: Lives independently  Disposition:  DC w/ discharge instructions/return precautions. All questions answered to patient's satisfaction.    Co morbidities that complicate the patient evaluation  Past Medical History:  Diagnosis Date  Hypertension      Medicines Meds ordered this encounter  Medications   acetaminophen  (TYLENOL ) tablet 1,000 mg   prochlorperazine (COMPAZINE) injection 10 mg   diphenhydrAMINE  (BENADRYL ) injection 25 mg   ketorolac (TORADOL) 15 MG/ML injection 15 mg    I have reviewed the patients home medicines and have made adjustments as needed  Problem List / ED Course: Problem List Items Addressed This Visit   None        {Document critical  care time when appropriate:1} {Document review of labs and clinical decision tools ie heart score, Chads2Vasc2 etc:1}  {Document your independent review of radiology images, and any outside records:1} {Document your discussion with family members, caretakers, and with consultants:1} {Document social determinants of health affecting pt's care:1} {Document your decision making why or why not admission, treatments were needed:1}  This note was created using dictation software, which may contain spelling or grammatical errors.

## 2023-05-17 NOTE — ED Triage Notes (Signed)
 Pt hit the top of her head at work yesterday and is now having jaw pain, teeth pain, and soreness at the top of her head along with feeling fatigued. Pt reports not remembering if she took her bp medication this morning.

## 2023-05-17 NOTE — Discharge Instructions (Signed)
 Thank you for coming to [facility] Emergency Department. You were seen for headache. We did an exam, labs, and imaging, and these showed no acute findings.  Your headache improved with medications.  Your blood pressure also improved with your home medications.  Please follow-up with your primary care physician within 1 week about your headache, likely a concussion, as well as your high blood pressure.  You may need adjustments to your medication. You can alternate taking Tylenol  and ibuprofen as needed for pain. You can take 650mg  tylenol  (acetaminophen ) every 4-6 hours, and 600 mg ibuprofen 3 times a day.   Do not hesitate to return to the ED or call 911 if you experience: -Worsening symptoms -Chest pain, shortness of breath -Visual changes, numbness/tingling, asymmetric weakness -Lightheadedness, passing out -Fevers/chills -Anything else that concerns you
